# Patient Record
Sex: Female | Born: 1960 | Race: Black or African American | Hispanic: No | Marital: Single | Smoking: Never smoker
Health system: Southern US, Community
[De-identification: ages and names within clinical notes are randomized; demographics above are authoritative.]

## PROBLEM LIST (undated history)

## (undated) DIAGNOSIS — E119 Type 2 diabetes mellitus without complications: Secondary | ICD-10-CM

## (undated) DIAGNOSIS — Z5189 Encounter for other specified aftercare: Secondary | ICD-10-CM

## (undated) DIAGNOSIS — D122 Benign neoplasm of ascending colon: Secondary | ICD-10-CM

## (undated) DIAGNOSIS — I10 Essential (primary) hypertension: Secondary | ICD-10-CM

## (undated) DIAGNOSIS — D649 Anemia, unspecified: Secondary | ICD-10-CM

## (undated) DIAGNOSIS — J309 Allergic rhinitis, unspecified: Secondary | ICD-10-CM

## (undated) HISTORY — PX: ABDOMINAL HYSTERECTOMY: SHX81

## (undated) HISTORY — PX: OTHER SURGICAL HISTORY: SHX169

## (undated) HISTORY — DX: Morbid (severe) obesity due to excess calories: E66.01

## (undated) HISTORY — PX: CARPAL TUNNEL RELEASE: SHX101

## (undated) HISTORY — DX: Type 2 diabetes mellitus without complications: E11.9

## (undated) HISTORY — DX: Anemia, unspecified: D64.9

---

## 1986-08-04 HISTORY — PX: TUBAL LIGATION: SHX77

## 1998-01-10 ENCOUNTER — Emergency Department (HOSPITAL_COMMUNITY): Admission: EM | Admit: 1998-01-10 | Discharge: 1998-01-11 | Payer: Self-pay | Admitting: Emergency Medicine

## 1999-03-19 ENCOUNTER — Encounter: Admission: RE | Admit: 1999-03-19 | Discharge: 1999-06-17 | Payer: Self-pay | Admitting: Obstetrics

## 1999-10-02 ENCOUNTER — Encounter: Admission: RE | Admit: 1999-10-02 | Discharge: 1999-10-02 | Payer: Self-pay | Admitting: Family Medicine

## 1999-10-02 ENCOUNTER — Encounter: Payer: Self-pay | Admitting: Family Medicine

## 2002-07-13 ENCOUNTER — Encounter: Admission: RE | Admit: 2002-07-13 | Discharge: 2002-07-13 | Payer: Self-pay | Admitting: Family Medicine

## 2002-07-13 ENCOUNTER — Encounter: Payer: Self-pay | Admitting: Family Medicine

## 2004-09-10 ENCOUNTER — Emergency Department: Payer: Self-pay | Admitting: Emergency Medicine

## 2004-09-11 ENCOUNTER — Other Ambulatory Visit: Payer: Self-pay

## 2004-09-12 ENCOUNTER — Ambulatory Visit: Payer: Self-pay | Admitting: Family Medicine

## 2004-09-23 ENCOUNTER — Ambulatory Visit: Payer: Self-pay | Admitting: Family Medicine

## 2004-10-30 ENCOUNTER — Encounter: Admission: RE | Admit: 2004-10-30 | Discharge: 2004-10-30 | Payer: Self-pay | Admitting: Family Medicine

## 2005-09-30 ENCOUNTER — Ambulatory Visit: Payer: Self-pay | Admitting: Internal Medicine

## 2005-10-07 ENCOUNTER — Ambulatory Visit: Payer: Self-pay | Admitting: Family Medicine

## 2006-01-08 ENCOUNTER — Emergency Department: Payer: Self-pay | Admitting: Emergency Medicine

## 2006-01-19 ENCOUNTER — Emergency Department: Payer: Self-pay | Admitting: Unknown Physician Specialty

## 2006-02-13 ENCOUNTER — Encounter: Admission: RE | Admit: 2006-02-13 | Discharge: 2006-02-13 | Payer: Self-pay | Admitting: Family Medicine

## 2006-02-13 ENCOUNTER — Other Ambulatory Visit: Admission: RE | Admit: 2006-02-13 | Discharge: 2006-02-13 | Payer: Self-pay | Admitting: Gynecology

## 2006-05-26 ENCOUNTER — Ambulatory Visit: Payer: Self-pay | Admitting: Family Medicine

## 2006-06-03 ENCOUNTER — Ambulatory Visit: Payer: Self-pay | Admitting: Cardiology

## 2006-06-15 ENCOUNTER — Ambulatory Visit: Payer: Self-pay

## 2006-06-23 ENCOUNTER — Ambulatory Visit: Payer: Self-pay | Admitting: Cardiology

## 2006-09-10 ENCOUNTER — Ambulatory Visit: Payer: Self-pay | Admitting: Family Medicine

## 2006-09-10 LAB — CONVERTED CEMR LAB
ALT: 8 units/L (ref 0–40)
AST: 16 units/L (ref 0–37)
Albumin: 3 g/dL — ABNORMAL LOW (ref 3.5–5.2)
Alkaline Phosphatase: 37 units/L — ABNORMAL LOW (ref 39–117)
BUN: 14 mg/dL (ref 6–23)
Basophils Absolute: 0 10*3/uL (ref 0.0–0.1)
Basophils Relative: 0.5 % (ref 0.0–1.0)
Bilirubin, Direct: 0.1 mg/dL (ref 0.0–0.3)
CO2: 28 meq/L (ref 19–32)
Calcium: 8.9 mg/dL (ref 8.4–10.5)
Chloride: 108 meq/L (ref 96–112)
Creatinine, Ser: 0.8 mg/dL (ref 0.4–1.2)
Eosinophils Absolute: 0.3 10*3/uL (ref 0.0–0.6)
Eosinophils Relative: 4.9 % (ref 0.0–5.0)
Ferritin: 3.3 ng/mL — ABNORMAL LOW (ref 10.0–291.0)
GFR calc Af Amer: 101 mL/min
GFR calc non Af Amer: 83 mL/min
Glucose, Bld: 87 mg/dL (ref 70–99)
HCT: 26.6 % — ABNORMAL LOW (ref 36.0–46.0)
Hemoglobin: 8.3 g/dL — ABNORMAL LOW (ref 12.0–15.0)
Lymphocytes Relative: 37.9 % (ref 12.0–46.0)
MCHC: 31.4 g/dL (ref 30.0–36.0)
MCV: 67.6 fL — ABNORMAL LOW (ref 78.0–100.0)
Magnesium: 1.8 mg/dL (ref 1.5–2.5)
Monocytes Absolute: 0.8 10*3/uL — ABNORMAL HIGH (ref 0.2–0.7)
Monocytes Relative: 12.1 % — ABNORMAL HIGH (ref 3.0–11.0)
Neutro Abs: 2.5 10*3/uL (ref 1.4–7.7)
Neutrophils Relative %: 44.6 % (ref 43.0–77.0)
Platelets: 439 10*3/uL — ABNORMAL HIGH (ref 150–400)
Potassium: 4 meq/L (ref 3.5–5.1)
RBC: 3.98 M/uL (ref 3.87–5.11)
RDW: 18.1 % — ABNORMAL HIGH (ref 11.5–14.6)
Retic Count, Absolute: 67.5 (ref 19.0–186.0)
Retic Ct Pct: 1.6 % (ref 0.4–3.1)
Sodium: 141 meq/L (ref 135–145)
TSH: 1 microintl units/mL (ref 0.35–5.50)
Total Bilirubin: 0.7 mg/dL (ref 0.3–1.2)
Total Protein: 6.1 g/dL (ref 6.0–8.3)
Vitamin B-12: 386 pg/mL (ref 211–911)
WBC: 5.7 10*3/uL (ref 4.5–10.5)

## 2006-09-14 ENCOUNTER — Ambulatory Visit: Payer: Self-pay | Admitting: Family Medicine

## 2006-10-22 ENCOUNTER — Ambulatory Visit: Payer: Self-pay | Admitting: Family Medicine

## 2006-10-22 LAB — CONVERTED CEMR LAB
Basophils Absolute: 0.1 10*3/uL (ref 0.0–0.1)
Basophils Relative: 1.3 % — ABNORMAL HIGH (ref 0.0–1.0)
Eosinophils Absolute: 0.4 10*3/uL (ref 0.0–0.6)
Eosinophils Relative: 6.7 % — ABNORMAL HIGH (ref 0.0–5.0)
Ferritin: 12.7 ng/mL (ref 10.0–291.0)
HCT: 33.3 % — ABNORMAL LOW (ref 36.0–46.0)
Hemoglobin: 10.7 g/dL — ABNORMAL LOW (ref 12.0–15.0)
Lymphocytes Relative: 42.2 % (ref 12.0–46.0)
MCHC: 32.2 g/dL (ref 30.0–36.0)
MCV: 75.3 fL — ABNORMAL LOW (ref 78.0–100.0)
Monocytes Absolute: 0.7 10*3/uL (ref 0.2–0.7)
Monocytes Relative: 12.7 % — ABNORMAL HIGH (ref 3.0–11.0)
Neutro Abs: 2 10*3/uL (ref 1.4–7.7)
Neutrophils Relative %: 37.1 % — ABNORMAL LOW (ref 43.0–77.0)
Platelets: 396 10*3/uL (ref 150–400)
RBC: 4.42 M/uL (ref 3.87–5.11)
RDW: 25.1 % — ABNORMAL HIGH (ref 11.5–14.6)
Vitamin B-12: 358 pg/mL (ref 211–911)
WBC: 5.3 10*3/uL (ref 4.5–10.5)

## 2006-10-26 ENCOUNTER — Ambulatory Visit: Payer: Self-pay | Admitting: Family Medicine

## 2006-12-24 ENCOUNTER — Encounter (INDEPENDENT_AMBULATORY_CARE_PROVIDER_SITE_OTHER): Payer: Self-pay | Admitting: *Deleted

## 2006-12-29 ENCOUNTER — Encounter: Payer: Self-pay | Admitting: Family Medicine

## 2007-04-05 ENCOUNTER — Emergency Department: Payer: Self-pay | Admitting: Emergency Medicine

## 2007-05-26 ENCOUNTER — Encounter: Admission: RE | Admit: 2007-05-26 | Discharge: 2007-05-26 | Payer: Self-pay | Admitting: Family Medicine

## 2007-05-27 ENCOUNTER — Encounter (INDEPENDENT_AMBULATORY_CARE_PROVIDER_SITE_OTHER): Payer: Self-pay | Admitting: *Deleted

## 2008-05-25 ENCOUNTER — Ambulatory Visit: Payer: Self-pay | Admitting: Family Medicine

## 2008-05-30 ENCOUNTER — Ambulatory Visit: Payer: Self-pay | Admitting: Family Medicine

## 2009-01-03 ENCOUNTER — Ambulatory Visit: Payer: Self-pay | Admitting: Family Medicine

## 2009-01-04 LAB — CONVERTED CEMR LAB
FSH: 5.1 milliintl units/mL
LH: 1.64 milliintl units/mL
Prolactin: 11.4 ng/mL
TSH: 0.79 microintl units/mL (ref 0.35–5.50)

## 2009-01-22 ENCOUNTER — Ambulatory Visit: Payer: Self-pay | Admitting: Women's Health

## 2009-02-01 DIAGNOSIS — D649 Anemia, unspecified: Secondary | ICD-10-CM

## 2009-02-01 HISTORY — DX: Anemia, unspecified: D64.9

## 2009-02-19 ENCOUNTER — Ambulatory Visit: Payer: Self-pay | Admitting: Women's Health

## 2009-02-23 ENCOUNTER — Observation Stay (HOSPITAL_COMMUNITY): Admission: AD | Admit: 2009-02-23 | Discharge: 2009-02-24 | Payer: Self-pay | Admitting: Gynecology

## 2009-02-23 ENCOUNTER — Ambulatory Visit: Payer: Self-pay | Admitting: Gynecology

## 2009-03-07 ENCOUNTER — Ambulatory Visit: Payer: Self-pay | Admitting: Women's Health

## 2009-05-18 ENCOUNTER — Telehealth (INDEPENDENT_AMBULATORY_CARE_PROVIDER_SITE_OTHER): Payer: Self-pay | Admitting: Internal Medicine

## 2009-05-28 ENCOUNTER — Ambulatory Visit: Payer: Self-pay | Admitting: Women's Health

## 2009-05-28 ENCOUNTER — Encounter: Payer: Self-pay | Admitting: Women's Health

## 2009-05-28 ENCOUNTER — Other Ambulatory Visit: Admission: RE | Admit: 2009-05-28 | Discharge: 2009-05-28 | Payer: Self-pay | Admitting: Gynecology

## 2009-08-04 DIAGNOSIS — IMO0001 Reserved for inherently not codable concepts without codable children: Secondary | ICD-10-CM

## 2009-08-04 DIAGNOSIS — Z5189 Encounter for other specified aftercare: Secondary | ICD-10-CM

## 2009-08-04 HISTORY — DX: Encounter for other specified aftercare: Z51.89

## 2009-08-04 HISTORY — DX: Reserved for inherently not codable concepts without codable children: IMO0001

## 2009-08-14 ENCOUNTER — Ambulatory Visit: Payer: Self-pay | Admitting: Women's Health

## 2009-08-23 ENCOUNTER — Encounter: Payer: Self-pay | Admitting: Family Medicine

## 2009-08-23 ENCOUNTER — Ambulatory Visit: Payer: Self-pay | Admitting: Family Medicine

## 2009-08-27 ENCOUNTER — Telehealth: Payer: Self-pay | Admitting: Family Medicine

## 2009-08-28 ENCOUNTER — Ambulatory Visit: Payer: Self-pay | Admitting: Family Medicine

## 2009-11-01 ENCOUNTER — Ambulatory Visit: Payer: Self-pay | Admitting: Women's Health

## 2010-01-24 ENCOUNTER — Ambulatory Visit: Payer: Self-pay | Admitting: Women's Health

## 2010-03-06 ENCOUNTER — Encounter (INDEPENDENT_AMBULATORY_CARE_PROVIDER_SITE_OTHER): Payer: Self-pay | Admitting: *Deleted

## 2010-03-22 ENCOUNTER — Ambulatory Visit: Payer: Self-pay | Admitting: Family Medicine

## 2010-03-22 DIAGNOSIS — I10 Essential (primary) hypertension: Secondary | ICD-10-CM

## 2010-03-22 DIAGNOSIS — J309 Allergic rhinitis, unspecified: Secondary | ICD-10-CM | POA: Insufficient documentation

## 2010-03-22 DIAGNOSIS — B351 Tinea unguium: Secondary | ICD-10-CM

## 2010-04-24 ENCOUNTER — Ambulatory Visit: Payer: Self-pay | Admitting: Women's Health

## 2010-06-11 ENCOUNTER — Encounter (INDEPENDENT_AMBULATORY_CARE_PROVIDER_SITE_OTHER): Payer: Self-pay | Admitting: *Deleted

## 2010-06-18 ENCOUNTER — Ambulatory Visit: Payer: Self-pay | Admitting: Family Medicine

## 2010-09-03 NOTE — Letter (Signed)
Summary: Nadara Eaton letter  [place] at [facility]  [address] [place], [place] [address]   Phone: [phone]  Fax: [phone]       03/06/2010 MRN: [id number]  [facility] [address] [place], [place]  [address]  Dear Ms. Vest,  [facility] [place] and [place] announce the retirement of Arta Silence, M.D., from full-time practice at the [facility] office effective January 31, 2010 and his plans of returning part-time.  It is important to Dr. Hetty Ely and to our practice that you understand that [facility] has seven physicians in our office for your health care needs.  We will continue to offer the same exceptional care that you have today.    Dr. Hetty Ely has spoken to many of you about his plans for retirement and returning part-time in the fall.   We will continue to work with you through the transition to schedule appointments for you in the office and meet the high standards that [place] is committed to.   Again, it is with great pleasure that we share the news that Dr. Hetty Ely will return to [facility] at [facility] in October of 2011 with a reduced schedule.    If you have any questions, or would like to request an appointment with one of our physicians, please call us at [phone] and press the option for Scheduling an appointment.  We take pleasure in providing you with excellent patient care and look forward to seeing you at your next office visit.  Our [facility] Physicians are:  Tillman Abide, M.D. Laurita Quint, M.D. Roxy Manns, M.D. Kerby Nora, M.D. Hannah Beat, M.D. Ruthe Mannan, M.D. We proudly welcomed Raechel Ache, M.D. and Eustaquio Boyden, M.D. to the practice in July/August 2011.  Sincerely,  [facility]

## 2010-09-03 NOTE — Progress Notes (Signed)
Summary: This AM started productive cough and vomited x 1  Phone Note Call from Patient Call back at [phone]   Caller: Patient Call For: Dr. Dayton Martes Summary of Call: Pt saw Dr Dayton Martes on 08/23/09. Pt started this AM with  productive cough with dark brownish yellow phlegm this AM. Pt also vomited x1 after coughing episode.  Pt feels nauseated. Pt has not had wheezing but has had some shortness of breath. No fever but scratchy throat. When seen on Thur forgot to tell Dr. Dayton Martes has hx of problem with iron level being low and pt said her color in her face is pale this AM..  Pt was going back to work this AM but after got sick did not go to work. Pt uses  [facility]  [phone] if needed. Please advise.  Initial call taken by: Lewanda Rife LPN,  August 27, 2009 11:58 AM  Follow-up for Phone Call        Sounds like she vomited from coughing, which can happen.  can try cough suppresant.  In terms of feeling weak, pale/low iron, obviously needs to be seen for that.   Follow-up by: Ruthe Mannan MD,  August 27, 2009 12:02 PM  Additional Follow-up for Phone Call Additional follow up Details #1::        Patient notified as instructed by telephone. Pt has appt with Dr. Ermalene Searing on 08/28/09 at 3:45pm. Dr. Dayton Martes had said to ck with PCP but Dr. Hetty Ely did not have appt for  3 days and Dr. Dayton Martes said OK to workin with one of other drs. Lewanda Rife LPN  August 27, 2009 2:13 PM

## 2010-09-03 NOTE — Assessment & Plan Note (Signed)
Summary: 10:15  FEVER,CHILLS/CLE   Vital Signs:  Patient profile:   50 year old female Height:      65 inches Weight:      312.38 pounds BMI:     52.17 Temp:     98.4 degrees F oral Pulse rate:   80 / minute Pulse rhythm:   regular BP sitting:   130 / 84  (left arm) Cuff size:   large  Vitals Entered By: Delilah Shan CMA Duncan Dull) (August 23, 2009 10:19 AM) CC: Fever, chills   History of Present Illness: 50 yo with 3 day h/o fever, Tmax 99.5, chills, runny nose. No cough, no sore throat, no ear pain. Has h/o asthma--no wheezing, no shortness of breath. A lot of people at work with similar symptoms. Eating and drinking ok. No n/v/d. No rashes.  Current Medications (verified): 1)  Advair Diskus 250-50 Mcg/dose Misc (Fluticasone-Salmeterol) .... Take As Directed 2)  Proventil Hfa 108 (90 Base) Mcg/act Aers (Albuterol Sulfate) .Marland Kitchen.. 1-2 Puffs Every 4 Hrs As Needed Wheezing 3)  Iron 325 (65 Fe) Mg Tabs (Ferrous Sulfate) .... Takes 2 Per Day 4)  Depo-Provera 150 Mg/ml Susp (Medroxyprogesterone Acetate) .... As Directed  Allergies (verified): No Known Drug Allergies  Review of Systems      See HPI General:  Complains of chills and fever. ENT:  Complains of nasal congestion; denies earache, sinus pressure, and sore throat. CV:  Denies chest pain or discomfort. Resp:  Denies cough, shortness of breath, sputum productive, and wheezing.  Physical Exam  General:  alert, well-developed, well-nourished, and well-hydrated.  morbidly obese Ears:  R ear normal and L ear normal.   Nose:  External nasal examination shows no deformity or inflammation. Nasal mucosa are pink and moist without lesions or exudates. Mouth:  no exudates and pharyngeal erythema.   Lungs:  normal respiratory effort, no intercostal retractions, no accessory muscle use, and normal breath sounds.   Heart:  normal rate, regular rhythm, and no murmur.   Psych:  normally interactive and good eye contact.      Impression & Recommendations:  Problem # 1:  URI (ICD-465.9) Assessment New Likely viral.  Continue supportive care.  See patient instrucitons for details.  Complete Medication List: 1)  Advair Diskus 250-50 Mcg/dose Misc (Fluticasone-salmeterol) .... Take as directed 2)  Proventil Hfa 108 (90 Base) Mcg/act Aers (Albuterol sulfate) .Marland Kitchen.. 1-2 puffs every 4 hrs as needed wheezing 3)  Iron 325 (65 Fe) Mg Tabs (Ferrous sulfate) .... Takes 2 per day 4)  Depo-provera 150 Mg/ml Susp (Medroxyprogesterone acetate) .... As directed  Patient Instructions: 1)  Get plenty of rest, drink lots of clear liquids, and use Tylenol or Ibuprofen for fever and comfort. Return in 7-10 days if you're not better: sooner if you'er feeling worse.   Please let us know you start coughing up sputum or fevers worsen. Prescriptions: PROVENTIL HFA 108 (90 BASE) MCG/ACT AERS (ALBUTEROL SULFATE) 1-2 puffs every 4 hrs as needed wheezing  #1 x 1   Entered and Authorized by:   Ruthe Mannan MD   Signed by:   Ruthe Mannan MD on 08/23/2009   Method used:   Electronically to        [facility]* (retail)       [address]       [place], [place]  [address]       Ph: [phone]       Fax: [phone]   RxID:   [id number] ADVAIR DISKUS 250-50 MCG/DOSE MISC (  FLUTICASONE-SALMETEROL) take as directed  #1 x 6   Entered and Authorized by:   Ruthe Mannan MD   Signed by:   Ruthe Mannan MD on 08/23/2009   Method used:   Electronically to        [facility]* (retail)       [address]       [place], [place]  [address]       Ph: [phone]       Fax: [phone]   RxID:   [id number]   Current Allergies (reviewed today): No known allergies

## 2010-09-03 NOTE — Letter (Signed)
Summary: Out of Work  [facility] [employer] [facility] at [facility]  [address] [place], [place] [address]   Phone: [phone]  Fax: [phone]    August 23, 2009   Employee:  SHENEA GIACOBBE Furia    To Whom It May Concern:   For Medical reasons, please excuse the above named employee from work for the following dates:  Start:   08/23/2009  End:   08/25/2009  If you need additional information, please feel free to contact our office.         Sincerely,    Ruthe Mannan MD

## 2010-09-03 NOTE — Assessment & Plan Note (Signed)
Summary: NAUSEA, WEAK, HX LOW IRON LEVEL/RI  MD running behind. Pt left before being seen. No charge. Kerby Nora MD  September 10, 2009 11:02 AM  Vital Signs:  Patient profile:   50 year old female Height:      65 inches Weight:      312.38 pounds BMI:     52.17 Temp:     98.6 degrees F oral Pulse rate:   80 / minute Pulse rhythm:   regular BP sitting:   120 / 82  (left arm) Cuff size:   large  Vitals Entered By: Benny Lennert CMA Duncan Dull) (August 28, 2009 3:59 PM)  History of Present Illness: Chief complaint weak and history of low iron  Allergies (verified): No Known Drug Allergies   Complete Medication List: 1)  Advair Diskus 250-50 Mcg/dose Misc (Fluticasone-salmeterol) .... Take as directed 2)  Proventil Hfa 108 (90 Base) Mcg/act Aers (Albuterol sulfate) .Marland Kitchen.. 1-2 puffs every 4 hrs as needed wheezing 3)  Iron 325 (65 Fe) Mg Tabs (Ferrous sulfate) .... Takes 2 per day 4)  Depo-provera 150 Mg/ml Susp (Medroxyprogesterone acetate) .... As directed  Other Orders: No Charge Patient Arrived (NCPA0) (NCPA0)  Current Allergies (reviewed today): No known allergies

## 2010-09-03 NOTE — Assessment & Plan Note (Signed)
Summary: blood pressure elevated/alc   Vital Signs:  Patient profile:   50 year old female Height:      65 inches Weight:      311.0 pounds BMI:     51.94 Temp:     99.0 degrees F oral Pulse rate:   80 / minute Pulse rhythm:   regular BP sitting:   120 / 72  (left arm) Cuff size:   large  Vitals Entered By: Benny Lennert CMA Duncan Dull) (March 22, 2010 3:32 PM)  History of Present Illness: Chief complaint blood pressure elevated   [facility]...147[facility]152/97-95 in last week.  Since then BPs in nmla range. No headache, not dizzy.   Recently having congestion oin nostrils..no runny nose x 2 months. HAs year round allergies. No sinus pain. HAs been taking decongestant med in last few weeks. No wheeze, no cough.  Problems Prior to Update: 1)  Uri  (ICD-465.9) 2)  Irregular Menstrual Cycle  (ICD-626.4) 3)  Wheezing  (ICD-786.07) 4)  Hx, Family, Malignancy, Breast  (ICD-V16.3) 5)  Hx, Family, Malignancy, Ovary  (ICD-V16.41) 6)  Hx of Obesity  (ICD-278.00) 7)  Asthma  (ICD-493.90)  Current Medications (verified): 1)  Iron 325 (65 Fe) Mg Tabs (Ferrous Sulfate) .... Takes 2 Per Day 2)  Depo-Provera 150 Mg/ml Susp (Medroxyprogesterone Acetate) .... As Directed  Allergies (verified): No Known Drug Allergies  Past History:  Past medical, surgical, family and social histories (including risk factors) reviewed, and no changes noted (except as noted below).  Past Medical History: Reviewed history from 12/25/2006 and no changes required. Asthma Anemia secondary to menorrhagia  Past Surgical History: Reviewed history from 12/25/2006 and no changes required. NSVD x 2 [facility] Asthma Multip last 03/1998 weather triggers 2/yr No intubations Pelvic US- normal except left ovary not visualized well Dobutamine echo- normal (06/2006) Carpal tunnel release- bilateral Tubal ligation  Family History: Reviewed history from 12/25/2006 and no changes required. Father: A HTN, CABG x 4  (08/1998) Mother: dec 50 breast cancer Siblings: 2 brothers, 2 sisters- another sister deceased from ovarian cancer  Social History: Reviewed history from 05/25/2008 and no changes required. 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 ENT:  Denies earache. CV:  Denies chest pain or discomfort. Resp:  Denies shortness of breath.  Physical Exam  General:  morbidly obese female in NAD Mouth:  Oral mucosa and oropharynx without lesions or exudates.  Teeth in good repair. Neck:  no carotid bruit or thyromegaly no cervical or supraclavicular lymphadenopathy  Lungs:  Normal respiratory effort, chest expands symmetrically. Lungs are clear to auscultation, no crackles or wheezes. Heart:  Normal rate and regular rhythm. S1 and S2 normal without gallop, murmur, click, rub or other extra sounds. Pulses:  R and L posterior tibial pulses are full and equal bilaterally  Extremities:  no edema Skin:  Intact without suspicious lesions or rashes  B toenail fungus.  Psych:  Cognition and judgment appear intact. Alert and cooperative with normal attention span and concentration. No apparent delusions, illusions, hallucinations   Impression & Recommendations:  Problem # 1:  ELEVATED BLOOD PRESSURE WITHOUT DIAGNOSIS OF HYPERTENSION (ICD-796.2) Likely due to decongestants. Discussed lifestyle change, healthy eating habits, increasinfg exercise.   Problem # 2:  ALLERGIC RHINITIS CAUSE UNSPECIFIED (ICD-477.9) Does not tolerate nasal steroids. Start Zyrtec at bedtime.   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   Complete Medication List: 1)  Iron 325 (65 Fe) Mg Tabs (Ferrous sulfate) .... Takes 2 per day 2)  Depo-provera 150 Mg/ml Susp  (Medroxyprogesterone acetate) .... As directed  Patient Instructions: 1)  Fasting lipids, CMET Dx v77.91 2)   Start zyrtec at bedtime for allergies. 3)  Avoiding decongestants. 4)  Follow BP at home...goal BP <140/90 5)  Increase exercsie 3-5 times a day... avoid skipping meals. 6)   Syop liquid calories like soda, juice, sweet tea. etc.  7)  Follow up 3 months BP check/weight check.   Current Allergies (reviewed today): No known allergies   PAP Result Date:  09/04/2009 PAP Result:  normal PAP Next Due:  1 yr

## 2010-09-03 NOTE — Letter (Signed)
Summary: [place] No Show Letter  [place] at [facility]  [address] [place], [place] [address]   Phone: [phone]  Fax: [phone]    06/11/2010 MRN: [id number]  [facility] [place] [address] [place], [place]  [address]   Dear Stacy Meyer,   Our records indicate that you missed your scheduled appointment with ____LAB_________________ on __11.8.2011__________.  Please contact this office to reschedule your appointment as soon as possible.  It is important that you keep your scheduled appointments with your physician, so we can provide you the best care possible.  Please be advised that there may be a charge for "no show" appointments.    Sincerely,    at [facility]

## 2010-09-12 ENCOUNTER — Telehealth: Payer: Self-pay | Admitting: Family Medicine

## 2010-09-18 ENCOUNTER — Telehealth: Payer: Self-pay | Admitting: Family Medicine

## 2010-09-19 ENCOUNTER — Other Ambulatory Visit: Payer: Self-pay | Admitting: Women's Health

## 2010-09-19 ENCOUNTER — Encounter (INDEPENDENT_AMBULATORY_CARE_PROVIDER_SITE_OTHER): Payer: PRIVATE HEALTH INSURANCE | Admitting: Women's Health

## 2010-09-19 ENCOUNTER — Other Ambulatory Visit (HOSPITAL_COMMUNITY)
Admission: RE | Admit: 2010-09-19 | Discharge: 2010-09-19 | Disposition: A | Discharge status: Home / Self Care | Payer: PRIVATE HEALTH INSURANCE | Source: Ambulatory Visit | Attending: Gynecology | Admitting: Gynecology

## 2010-09-19 ENCOUNTER — Encounter: Payer: Self-pay | Admitting: Family Medicine

## 2010-09-19 DIAGNOSIS — R823 Hemoglobinuria: Secondary | ICD-10-CM

## 2010-09-19 DIAGNOSIS — Z124 Encounter for screening for malignant neoplasm of cervix: Secondary | ICD-10-CM | POA: Insufficient documentation

## 2010-09-19 DIAGNOSIS — Z833 Family history of diabetes mellitus: Secondary | ICD-10-CM

## 2010-09-19 DIAGNOSIS — Z01419 Encounter for gynecological examination (general) (routine) without abnormal findings: Secondary | ICD-10-CM

## 2010-09-19 DIAGNOSIS — Z13220 Encounter for screening for lipoid disorders: Secondary | ICD-10-CM

## 2010-09-19 NOTE — Progress Notes (Signed)
Summary: refill request for advair  Phone Note Refill Request Message from:  Fax from Pharmacy  Refills Requested: Medication #1:  advair 250/50   Last Refilled: 08/23/2009 Faxed request from [facility], this is no longer on med list.  Initial call taken by: Lowella Petties CMA, AAMA,  September 12, 2010 8:58 AM  Follow-up for Phone Call        Please have pt come in to see me if she would like to have me follow her. I haven't seen her in many years if I have ever seen her. Would appreciate 30 mins as her care has been fragmented at best for a while. Hold on Advair until seen....could give her one one week sample if really needed. Follow-up by: Shaune Leeks MD,  September 12, 2010 9:04 AM  Additional Follow-up for Phone Call Additional follow up Details #1::        Advised pt, appt scheduled for next week. Additional Follow-up by: Lowella Petties CMA, AAMA,  September 12, 2010 9:26 AM

## 2010-09-20 ENCOUNTER — Telehealth: Payer: Self-pay | Admitting: Family Medicine

## 2010-09-25 ENCOUNTER — Encounter: Payer: Self-pay | Admitting: Family Medicine

## 2010-09-25 ENCOUNTER — Ambulatory Visit (INDEPENDENT_AMBULATORY_CARE_PROVIDER_SITE_OTHER): Payer: PRIVATE HEALTH INSURANCE | Admitting: Family Medicine

## 2010-09-25 DIAGNOSIS — J45909 Unspecified asthma, uncomplicated: Secondary | ICD-10-CM

## 2010-09-25 DIAGNOSIS — E669 Obesity, unspecified: Secondary | ICD-10-CM

## 2010-09-25 DIAGNOSIS — Z803 Family history of malignant neoplasm of breast: Secondary | ICD-10-CM

## 2010-09-25 DIAGNOSIS — J309 Allergic rhinitis, unspecified: Secondary | ICD-10-CM

## 2010-09-25 DIAGNOSIS — R030 Elevated blood-pressure reading, without diagnosis of hypertension: Secondary | ICD-10-CM

## 2010-09-25 NOTE — Progress Notes (Signed)
Summary: requests advair  Phone Note Call from Patient   Caller: Patient Call For: Shaune Leeks MD Summary of Call: Pt has appt to see you 2/22.  Can we send one month of advair to [facility]?  Pt thought her appt was today, but it wasnt.         Lowella Petties CMA, AAMA  September 18, 2010 3:31 PM   Follow-up for Phone Call        Medicine called to [facility] [address], cancelled at [place]. Follow-up by: Lowella Petties CMA, AAMA,  September 19, 2010 8:41 AM    New/Updated Medications: ADVAIR DISKUS 100-50 MCG/DOSE AEPB (FLUTICASONE-SALMETEROL) one inh two times a day Prescriptions: ADVAIR DISKUS 100-50 MCG/DOSE AEPB (FLUTICASONE-SALMETEROL) one inh two times a day  #1 mdi x 0   Entered and Authorized by:   Shaune Leeks MD   Signed by:   Shaune Leeks MD on 09/18/2010   Method used:   Electronically to        [facility]* (retail)       [address]       [place], [place]  [address]       Ph: [phone]       Fax: [phone]   RxID:   [id number]

## 2010-09-25 NOTE — Progress Notes (Signed)
Summary: Rx Proventil  Phone Note From Pharmacy Call back at [phone]   Caller: [facility]* Call For: Dr. Dayton Martes  Summary of Call: Received faxed refill request for Proventil HFA inhaler which is no longer on patients medication list.  Please advise.  Medication was removed from list on 03/2010. Initial call taken by: Linde Gillis CMA Duncan Dull),  September 20, 2010 9:27 AM    New/Updated Medications: PROAIR HFA 108 (90 BASE) MCG/ACT  AERS (ALBUTEROL SULFATE) 2 inh q4h as needed shortness of breath Prescriptions: PROAIR HFA 108 (90 BASE) MCG/ACT  AERS (ALBUTEROL SULFATE) 2 inh q4h as needed shortness of breath  #1 x 1   Entered and Authorized by:   Ruthe Mannan MD   Signed by:   Ruthe Mannan MD on 09/20/2010   Method used:   Electronically to        [facility]* (retail)       [address]       [place], [place]  [address]       Ph: [phone]       Fax: [phone]   RxID:   [id number]

## 2010-10-01 NOTE — Assessment & Plan Note (Signed)
Summary: RENEW MEDS- 30 MINS PER DR Siobhan Zaro   Vital Signs:  Patient profile:   50 year old female Weight:      321.75 pounds Temp:     98.8 degrees F oral Pulse rate:   76 / minute Pulse rhythm:   regular BP sitting:   128 / 78  (left arm) Cuff size:   large  Vitals Entered By: Sydell Axon LPN (September 25, 2010 3:28 PM) CC: Renew medication   History of Present Illness: Pt sees Gyn, Dr Maryelizabeth Rowan at [facility]. She had labwork done as well. She has some probs with allergies...she takes Sudafed!!! Told to stop, Discussed allergy meds that would be acceptable to take. She is now working at [employer]. HEr father died last year. She feels well and has no complaints. She uses ProAir and Advair as needed. She typically has them filled once a year.   Preventive Screening-Counseling & Management  Alcohol-Tobacco     Alcohol drinks/day: <1     Alcohol type: brandy     Smoking Status: never  Caffeine-Diet-Exercise     Caffeine use/day: 4     Does Patient Exercise: no  Problems Prior to Update: 1)  Onychomycosis, Bilateral  (ICD-110.1) 2)  Allergic Rhinitis Cause Unspecified  (ICD-477.9) 3)  Elevated Blood Pressure Without Diagnosis of Hypertension  (ICD-796.2) 4)  Irregular Menstrual Cycle  (ICD-626.4) 5)  Wheezing  (ICD-786.07) 6)  Hx, Family, Malignancy, Breast  (ICD-V16.3) 7)  Hx, Family, Malignancy, Ovary  (ICD-V16.41) 8)  Hx of Obesity  (ICD-278.00) 9)  Asthma  (ICD-493.90)  Medications Prior to Update: 1)  Iron 325 (65 Fe) Mg Tabs (Ferrous Sulfate) .... Takes 2 Per Day 2)  Depo-Provera 150 Mg/ml Susp (Medroxyprogesterone Acetate) .... As Directed 3)  Advair Diskus 100-50 Mcg/dose Aepb (Fluticasone-Salmeterol) .... One Inh Two Times A Day 4)  Proair Hfa 108 (90 Base) Mcg/act  Aers (Albuterol Sulfate) .... 2 Inh Q4h As Needed Shortness of Breath  Current Medications (verified): 1)  Advair Diskus 100-50 Mcg/dose Aepb (Fluticasone-Salmeterol) .... One Inh Two  Times A Day 2)  Proair Hfa 108 (90 Base) Mcg/act  Aers (Albuterol Sulfate) .... 2 Inh Q4h As Needed Shortness of Breath 3)  Integra Plus  Caps (Fefum-Fepoly-Fa-B Cmp-C-Biot) .... Take One By Mouth Daily 4)  Errin 0.35 Mg Tabs (Norethindrone) .... Take One By Mouth Daily  Allergies: No Known Drug Allergies  Past History:  Past Medical History: Last updated: 12/25/2006 Asthma Anemia secondary to menorrhagia  Past Surgical History: Last updated: 12/25/2006 NSVD x 2 [facility] Asthma Multip last 03/1998 weather triggers 2/yr No intubations Pelvic US- normal except left ovary not visualized well Dobutamine echo- normal (06/2006) Carpal tunnel release- bilateral Tubal ligation  Family History: Last updated: 09/25/2010 Father: dec 72 Lung Dz Pulm Edema CHF  HTN, CABG x 4 (08/1998) Mother: dec 50 breast cancer Brother dec 47 Mouth Cancer (smoke/alcoholic) Brother A 46 Alcoholic HTN Train Accdt w/ one leg Sister A 49 Alcoholic Ca ? Sister A 36 Htn   Sister dec 30  ovarian cancer  Social History: Last updated: 09/25/2010 Marital Status: single Children: 2 sons Occupation: [employer] nurse  Risk Factors: Alcohol Use: <1 (09/25/2010) Caffeine Use: 4 (09/25/2010) Exercise: no (09/25/2010)  Risk Factors: Smoking Status: never (09/25/2010)  Family History: Father: dec 72 Lung Dz Pulm Edema CHF  HTN, CABG x 4 (08/1998) Mother: dec 50 breast cancer Brother dec 47 Mouth Cancer (smoke/alcoholic) Brother A 46 Alcoholic HTN Train Accdt w/ one  leg Sister A 49 Alcoholic Ca ? Sister A 48 Htn   Sister dec 30  ovarian cancer  Social History: Marital Status: single Children: 2 sons Occupation: Environmental health practitioner Caffeine use/day:  4 Does Patient Exercise:  no  Review of Systems General:  Denies chills, fatigue, fever, sweats, weakness, and weight loss; sleeps 2-4 hrs. Taken off Depo in Dec.. Eyes:  Denies blurring, discharge, and eye pain. ENT:  Denies decreased hearing, ear  discharge, earache, and ringing in ears. CV:  Complains of palpitations and shortness of breath with exertion; denies chest pain or discomfort, fainting, fatigue, swelling of feet, and swelling of hands; too much caffeine. Resp:  Denies cough, shortness of breath, and wheezing. GI:  Denies abdominal pain, bloody stools, change in bowel habits, constipation, dark tarry stools, diarrhea, indigestion, loss of appetite, nausea, vomiting, vomiting blood, and yellowish skin color. GU:  Denies discharge and dysuria. MS:  Complains of low back pain; denies joint pain, muscle aches, cramps, and stiffness. Derm:  Denies dryness, itching, and rash. Neuro:  Denies numbness, poor balance, tingling, and tremors.  Physical Exam  General:  morbidly obese female in NAD Head:  Normocephalic and atraumatic without obvious abnormalities. No apparent alopecia or balding. Sinuses NT. Eyes:  Conjunctiva clear bilaterally.  Ears:  External ear exam shows no significant lesions or deformities.  Otoscopic examination reveals clear canals, tympanic membranes are intact bilaterally without bulging, retraction, inflammation or discharge. Hearing is grossly normal bilaterally. Nose:  External nasal examination shows no deformity or inflammation. Nasal mucosa are pink and moist without lesions or exudates. Mouth:  Oral mucosa and oropharynx without lesions or exudates.  Teeth in good repair. Neck:  No deformities, masses, or tenderness noted. Chest Wall:  No deformities, masses, or tenderness noted. Breasts:  Not done, Gyn. Lungs:  Normal respiratory effort, chest expands symmetrically. Lungs are clear to auscultation, no crackles or wheezes. Heart:  Normal rate and regular rhythm. S1 and S2 normal without gallop, murmur, click, rub or other extra sounds. Abdomen:  Bowel sounds positive,abdomen soft and non-tender without masses, organomegaly or hernias noted. Obese with panniculous. Rectal:  Not done, Gyn Genitalia:  Not  done, Gyn Msk:  No deformity or scoliosis noted of thoracic or lumbar spine.   Pulses:  R and L posterior tibial pulses are full and equal bilaterally  Extremities:  No clubbing, cyanosis, edema, or deformity noted with normal full range of motion of all joints.   Neurologic:  No cranial nerve deficits noted. Station and gait are normal. Sensory, motor and coordinative functions appear intact. Skin:  Intact without suspicious lesions or rashes  B toenail fungus.  Cervical Nodes:  No lymphadenopathy noted Inguinal Nodes:  No significant adenopathy Psych:  Cognition and judgment appear intact. Alert and cooperative with normal attention span and concentration. No apparent delusions, illusions, hallucinations   Impression & Recommendations:  Problem # 1:  ELEVATED BLOOD PRESSURE WITHOUT DIAGNOSIS OF HYPERTENSION (ICD-796.2) Assessment Improved Stabile, nml. Today 128/78 Prior BP: 120/72 (03/22/2010)  Labs Reviewed: Creat: 0.8 (09/10/2006)  Instructed in low sodium diet (DASH Handout) and behavior modification.    Problem # 2:  HX, FAMILY, MALIGNANCY, BREAST (ICD-V16.3) Assessment: Unchanged LAst mammo 2008...encouraged to get one...she is scheduled.  Problem # 3:  Hx of OBESITY (ICD-278.00)  Problem # 4:  ASTHMA (ICD-493.90) Assessment: Unchanged Discussed using ProAir less than once a week. If needed more, use Advair regularly for 2-4 weeks and then go back to as needed regimen. Her updated medication  list for this problem includes:    Advair Diskus 100-50 Mcg/dose Aepb (Fluticasone-salmeterol) ..... One inh two times a day    Proair Hfa 108 (90 Base) Mcg/act Aers (Albuterol sulfate) .Marland Kitchen... 2 inh q4h as needed shortness of breath  Problem # 5:  ALLERGIC RHINITIS CAUSE UNSPECIFIED (ICD-477.9) Assessment: Unchanged  Discussed use of allergy medications and environmental measures. May use Claritin, Allegra or Zyrtec.  Complete Medication List: 1)  Advair Diskus 100-50 Mcg/dose  Aepb (Fluticasone-salmeterol) .... One inh two times a day 2)  Proair Hfa 108 (90 Base) Mcg/act Aers (Albuterol sulfate) .... 2 inh q4h as needed shortness of breath 3)  Integra Plus Caps (Fefum-fepoly-fa-b cmp-c-biot) .... Take one by mouth daily 4)  Errin 0.35 Mg Tabs (Norethindrone) .... Take one by mouth daily  Patient Instructions: 1)  RTC as needed. One year if not before. 2)  25 mins spent with pt. Prescriptions: PROAIR HFA 108 (90 BASE) MCG/ACT  AERS (ALBUTEROL SULFATE) 2 inh q4h as needed shortness of breath  #1 x 1   Entered and Authorized by:   Shaune Leeks MD   Signed by:   Shaune Leeks MD on 09/25/2010   Method used:   Print then Give to Patient   RxID:   [id number] ADVAIR DISKUS 100-50 MCG/DOSE AEPB (FLUTICASONE-SALMETEROL) one inh two times a day  #1 mdi x 1   Entered and Authorized by:   Shaune Leeks MD   Signed by:   Shaune Leeks MD on 09/25/2010   Method used:   Print then Give to Patient   RxID:   [id number]    Orders Added: 1)  Est. Patient Level IV [03474]    Current Allergies (reviewed today): No known allergies

## 2010-11-10 LAB — CROSSMATCH
ABO/RH(D): O POS
Antibody Screen: NEGATIVE

## 2010-11-10 LAB — ABO/RH: ABO/RH(D): O POS

## 2010-11-10 LAB — CBC
Hemoglobin: 8.6 g/dL — ABNORMAL LOW (ref 12.0–15.0)
MCHC: 30 g/dL (ref 30.0–36.0)
MCV: 74.3 fL — ABNORMAL LOW (ref 78.0–100.0)
RBC: 3.86 MIL/uL — ABNORMAL LOW (ref 3.87–5.11)
WBC: 7.6 10*3/uL (ref 4.0–10.5)

## 2010-12-20 NOTE — Assessment & Plan Note (Signed)
[facility]                              CARDIOLOGY OFFICE NOTE   Ramani, Stacy Meyer                     MRN:          [id number]  DATE:06/03/2006                            DOB:          November 14, 1960    REFERRING PHYSICIAN:  Arta Silence, MD   REASON FOR VISIT:  Chest pain.   HISTORY OF PRESENT ILLNESS:  [name] is a pleasant 50 year old woman with  morbid obesity, longstanding asthma, and apparent hypertension. She has no  significant tobacco use history and states that her father developed  coronary artery disease around age 38. She has been in her usual state of  health until approximately 2 weeks ago. She was at work [employer])  and states that she felt somewhat lightheaded. She also began to experience  a sharp left-sided chest pain that was present off and on over the next 3  days. This was worse with movement and twisting of the thorax but not  associated with cough, active wheezing or hemoptysis. Since that time,  symptoms have completely resolved without specific intervention and she  feels back to baseline. She states in retrospect that she has felt this in  the past, had asthma exacerbations although this was more severe and at this  point she was not wheezing.   ALLERGIES:  No known drug allergies.   CURRENT MEDICATIONS:  1. Advair 250/50 daily.  2. Proventil p.r.n.   PAST MEDICAL HISTORY:  As outlined in the history of present illness. She  has also had bilateral carpal tunnel release surgery in 1994 and bilateral  tubal ligation in 1989.   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 She denies any tobacco use history. She drinks  alcohol socially. She has 3-5 caffeinated beverages daily.   REVIEW OF SYSTEMS:  As described in the history of present illness,  otherwise  negative.   FAMILY HISTORY:  Significant for breast cancer in the patient's mother who  died at the age of 22, ovarian cancer in the patient's sister who died at  age 53 and another type of cancer in one of the patient's brother who died  at age 48. Father is alive at age 77 with history of bypass surgery having  developed coronary artery disease around the age of 2.   PHYSICAL EXAMINATION:  VITAL SIGNS:  Blood pressure is 149/70, heart rate is  61, weight is 281 pounds.  GENERAL:  An obese woman in no acute distress without labored breathing or  chest pain.  HEENT:  Conjunctiva and lids are normal, oropharynx is clear.  NECK:  Supple without elevated jugular venous pressure or loud bruits. No  thyromegaly is noted.  LUNGS:  Exhibit clear breath sounds. No active wheezing.  CARDIAC:  Reveals a regular rate and rhythm without loud murmur or gallop.  ABDOMEN:  Soft and nontender. Bowel sounds present.  EXTREMITIES:  Show trace edema. Distal pulses are 2+.  SKIN:  No ulcer changes noted.  MUSCULOSKELETAL:  No kyphosis is noted.  NEUROPSYCHIATRIC:  The patient is alert and oriented x3.   A 12-lead electrocardiogram today shows normal sinus rhythm at 63 beats per  minute.   IMPRESSION:  1. Transient episodes of chest pain as outlined above. Description is      somewhat atypical for ischemic heart disease. She does have a longer      standing history of asthma. She states that her blood pressure has been      elevated recently and she does have a family history of coronary      disease. Her resting electrocardiogram is normal and she has had no      prior risk stratification. She also mentions that she has had some      lower extremity edema recently. We will therefore plan an      echocardiogram to assess left and right ventricular function and      valvular status then proceed with a dobutamine echocardiogram for      stress testing assuming that she has adequate echocardiographic       windows. Otherwise this will be changed to a dobutamine Myoview and      then we will have the patient followup in the office with [place] to discuss      the results. I will not any specific medication adjustments at this      particular time.  2. Further plans to follow.     Jonelle Sidle, MD  Electronically Signed    SGM/MedQ  DD: 06/03/2006  DT: 06/03/2006  Job #: [id number]   cc:   Arta Silence, MD

## 2010-12-20 NOTE — Cardiovascular Report (Signed)
[facility]                                  ECHOCARDIOGRAM   ADELY, FACER                     MRN:          [id number]  DATE:06/15/2006                            DOB:          Jul 31, 1961    REFERRING PHYSICIAN:  Jonelle Sidle, MD   CLINICAL DATA:  A 50 year old woman with history of bronchospastic lung  disease, multiple cardiovascular risk factors, and chest pain.   1. Progressive dobutamine infusion yielded a peak heart rate of 151, 85%      of the patient's age predicted maximum.  The only symptoms were      palpitations and mild flushing. Occasional PVCs occurred.  2. Baseline EKG: Normal sinus rhythm; minimal nonspecific ST-T wave      abnormality.  3. Stress EKG: Insignificant upsloping ST-segment depression.  4. Baseline echocardiogram: Normal left ventricular size and unction.  5. Stress echocardiogram:  Decreased left ventricular volume; marked      increase in regional and global contractility resulting in cavity      obliteration.   IMPRESSION:  Negative pharmacologic stress echocardiogram revealing neither  electrocardiographic nor echocardiographic evidence for myocardial ischemia  or infarction.  Other findings as noted.     Gerrit Friends. Dietrich Pates, MD, [facility]  Electronically Signed    RMR/MedQ  DD: 06/15/2006  DT: 06/15/2006  Job #: [id number]

## 2010-12-20 NOTE — Assessment & Plan Note (Signed)
[facility]                              CARDIOLOGY OFFICE NOTE   Stacy Meyer, Stacy Meyer                     MRN:          [id number]  DATE:06/23/2006                            DOB:          [birth year]    PRIMARY CARE PHYSICIAN:  Stacy Silence, MD.   REASON FOR VISIT:  Followup cardiac testing.   HISTORY OF PRESENT ILLNESS:  I saw Stacy Meyer back on the 31st of October.  She had presented at that time with somewhat atypical chest pain in the  setting of hypertension, obesity, asthma and premature cardiovascular  disease in her family. We referred her for further risk stratification and  she underwent a dobutamine echocardiogram. This study was interpreted by Dr.  Dietrich Meyer and showed no evidence of ischemia either by echocardiographic or  electrocardiographic criteria. I discussed the results with her today and  she was quite reassured. She states that she has had no further symptoms  since our last visit. I encouraged her to continue a strategy of risk factor  modification. She has been trying to work on weight loss recently.   ALLERGIES:  No known drug allergies.   CURRENT MEDICATIONS:  1. Advair 250/50.  2. Proventil p.r.n.   REVIEW OF SYSTEMS:  As described in the history of present illness.   PHYSICAL EXAMINATION:  VITAL SIGNS:  Blood pressure is 127/72, heart rate is  77, weight is 278 pounds.  GENERAL:  The patient is comfortable and in no acute distress.  LUNGS:  Clear without labored breathing at rest.  CARDIAC:  Reveals a regular rate and rhythm without loud murmur or gallop.  EXTREMITIES:  Show no pitting edema.   IMPRESSION/RECOMMENDATIONS:  1. Atypical chest pain. Recent dobutamine echocardiogram was normal and at      this point I would not anticipate any additional cardiac testing. [name] has been symptom-free since her last visit and I have encouraged      her to continue her strategy of risk factor  modification and regular followup with Dr. Hetty Meyer.  2. We can certainly see her back as needed.     Stacy Sidle, MD  Electronically Signed    SGM/MedQ  DD: 06/23/2006  DT: 06/23/2006  Job #: [id number]   cc:   Stacy Silence, MD

## 2011-08-19 ENCOUNTER — Other Ambulatory Visit: Payer: Self-pay | Admitting: Women's Health

## 2011-08-19 DIAGNOSIS — Z1231 Encounter for screening mammogram for malignant neoplasm of breast: Secondary | ICD-10-CM

## 2011-09-02 ENCOUNTER — Other Ambulatory Visit: Payer: Self-pay | Admitting: Women's Health

## 2011-09-02 ENCOUNTER — Ambulatory Visit
Admission: RE | Admit: 2011-09-02 | Discharge: 2011-09-02 | Disposition: A | Discharge status: Home / Self Care | Payer: PRIVATE HEALTH INSURANCE | Source: Ambulatory Visit | Attending: Women's Health | Admitting: Women's Health

## 2011-09-02 DIAGNOSIS — Z1231 Encounter for screening mammogram for malignant neoplasm of breast: Secondary | ICD-10-CM

## 2011-09-17 ENCOUNTER — Other Ambulatory Visit: Payer: Self-pay | Admitting: Women's Health

## 2011-09-18 DIAGNOSIS — J4520 Mild intermittent asthma, uncomplicated: Secondary | ICD-10-CM | POA: Insufficient documentation

## 2011-09-18 DIAGNOSIS — D649 Anemia, unspecified: Secondary | ICD-10-CM | POA: Insufficient documentation

## 2011-09-24 ENCOUNTER — Encounter: Payer: Self-pay | Admitting: Women's Health

## 2011-09-24 ENCOUNTER — Ambulatory Visit (INDEPENDENT_AMBULATORY_CARE_PROVIDER_SITE_OTHER): Payer: PRIVATE HEALTH INSURANCE | Admitting: Women's Health

## 2011-09-24 VITALS — BP 120/78 | Ht 64.0 in | Wt 329.0 lb

## 2011-09-24 DIAGNOSIS — Z833 Family history of diabetes mellitus: Secondary | ICD-10-CM

## 2011-09-24 DIAGNOSIS — D259 Leiomyoma of uterus, unspecified: Secondary | ICD-10-CM

## 2011-09-24 DIAGNOSIS — N920 Excessive and frequent menstruation with regular cycle: Secondary | ICD-10-CM

## 2011-09-24 DIAGNOSIS — D219 Benign neoplasm of connective and other soft tissue, unspecified: Secondary | ICD-10-CM

## 2011-09-24 DIAGNOSIS — Z01419 Encounter for gynecological examination (general) (routine) without abnormal findings: Secondary | ICD-10-CM

## 2011-09-24 LAB — GLUCOSE, RANDOM: Glucose, Bld: 91 mg/dL (ref 70–99)

## 2011-09-24 MED ORDER — MEDROXYPROGESTERONE ACETATE 10 MG PO TABS: 10.0000 mg | ORAL_TABLET | Freq: Every day | ORAL | Status: DC

## 2011-09-24 MED ORDER — NORETHINDRONE 0.35 MG PO TABS: 1.0000 | ORAL_TABLET | Freq: Every day | ORAL | Status: DC

## 2011-09-24 NOTE — Progress Notes (Signed)
Stacy Meyer September 16, 1960 [id number]    History:    The patient presents for annual exam.  History of extreme anemia from menorrhagia/fibroids needing transfusions in 2010. Was on Depo provera 150 q 3 months until 08/2010 when DepoProvera no longer covered by insurance and has been on Micronor since. Was having light monthly cycles until November 2012, states had increased menorrhagia and bled most of the month, 2 heavy cycles the month of December, no cycle in January, and currently bleeding greater than 7 days. History normal Paps and mammograms. History of BTL in 3, not sexually active in years. History of normal TSH and prolactin, sonohysterogram in 2010 without defect noted.  Past medical history, past surgical history, family history and social history were all reviewed and documented in the EPIC chart.   ROS:  A  ROS was performed and pertinent positives and negatives are included in the history.  Exam:  Filed Vitals:   09/24/11 1507  BP: 120/78    General appearance:  Normal Head/Neck:  Normal, without cervical or supraclavicular adenopathy. Thyroid:  Symmetrical, normal in size, without palpable masses or nodularity. Respiratory  Effort:  Normal  Auscultation:  Clear without wheezing or rhonchi Cardiovascular  Auscultation:  Regular rate, without rubs, murmurs or gallops  Edema/varicosities:  Not grossly evident Abdominal  Soft,nontender, without masses, guarding or rebound.  Liver/spleen:  No organomegaly noted  Hernia:  None appreciated  Skin  Inspection:  Grossly normal  Palpation:  Grossly normal Neurologic/psychiatric  Orientation:  Normal with appropriate conversation.  Mood/affect:  Normal  Genitourinary    Breasts: Examined lying and sitting.     Right: Without masses, retractions, discharge or axillary adenopathy.     Left: Without masses, retractions, discharge or axillary adenopathy.   Inguinal/mons:  Normal without inguinal adenopathy  External  genitalia:  Normal  BUS/Urethra/Skene's glands:  Normal  Bladder:  Normal  Vagina:  Normal  Cervix:  Normal/moderate amount of menses  Uterus:   Fibroids.  Midline and mobile/limited due to obesity  Adnexa/parametria:     Rt: Without masses or tenderness.   Lt: Without masses or tenderness.  Anus and perineum: Normal  Digital rectal exam: Normal sphincter tone without palpated masses or tenderness  Assessment/Plan:  51 y.o. SBF G4 P2 for annual exam with complaint of increased menorrhagia on micronor and weight gain.  History of severe anemia (4.9/15.9)  02/2009  from menorrhagia/fibroids Morbid obesity  Plan: Provera 10 mg daily for 10 days and schedule Sonohysterogram with biopsy with Dr. Lily Peer after bleeding stops. Resume Micronor daily. Prescription, proper use given and reviewed, continue until plan can be made after sonohysterogram. Reviewed briefly HER option. Declines Mirena  IUD states does not want. MVI with iron supplement daily, continue increased iron rich foods. Condoms encouraged if becomes sexually active. SBE's, a normal mammogram encouraged. Reviewed importance of increasing exercise, decreasing calories for weight loss. Had excellent lipid profile last year. CBC, glucose,TSH, Pap.    Harrington Challenger [facility], 5:07 PM 09/24/2011

## 2011-09-25 ENCOUNTER — Other Ambulatory Visit (HOSPITAL_COMMUNITY)
Admission: RE | Admit: 2011-09-25 | Discharge: 2011-09-25 | Disposition: A | Discharge status: Home / Self Care | Payer: PRIVATE HEALTH INSURANCE | Source: Ambulatory Visit | Attending: Obstetrics and Gynecology | Admitting: Obstetrics and Gynecology

## 2011-09-25 DIAGNOSIS — Z01419 Encounter for gynecological examination (general) (routine) without abnormal findings: Secondary | ICD-10-CM | POA: Insufficient documentation

## 2011-09-25 LAB — CBC WITH DIFFERENTIAL/PLATELET
Basophils Relative: 1 % (ref 0–1)
HCT: 38.2 % (ref 36.0–46.0)
Lymphocytes Relative: 37 % (ref 12–46)
Lymphs Abs: 2.8 10*3/uL (ref 0.7–4.0)
MCH: 26.9 pg (ref 26.0–34.0)
MCV: 86.4 fL (ref 78.0–100.0)
Monocytes Absolute: 0.7 10*3/uL (ref 0.1–1.0)
Platelets: 342 10*3/uL (ref 150–400)
RDW: 13.5 % (ref 11.5–15.5)

## 2011-09-25 LAB — TSH: TSH: 1.469 u[IU]/mL (ref 0.350–4.500)

## 2011-09-25 NOTE — Progress Notes (Signed)
Addended by: Venora Maples on: 09/25/2011 10:06 AM   Modules accepted: Orders

## 2011-10-15 ENCOUNTER — Other Ambulatory Visit: Payer: Self-pay | Admitting: Gynecology

## 2011-10-15 ENCOUNTER — Other Ambulatory Visit: Payer: PRIVATE HEALTH INSURANCE

## 2011-10-15 ENCOUNTER — Ambulatory Visit: Payer: PRIVATE HEALTH INSURANCE | Admitting: Gynecology

## 2011-10-15 DIAGNOSIS — N920 Excessive and frequent menstruation with regular cycle: Secondary | ICD-10-CM

## 2011-10-15 DIAGNOSIS — D219 Benign neoplasm of connective and other soft tissue, unspecified: Secondary | ICD-10-CM

## 2011-10-22 ENCOUNTER — Ambulatory Visit (INDEPENDENT_AMBULATORY_CARE_PROVIDER_SITE_OTHER): Payer: PRIVATE HEALTH INSURANCE

## 2011-10-22 ENCOUNTER — Ambulatory Visit (INDEPENDENT_AMBULATORY_CARE_PROVIDER_SITE_OTHER): Payer: PRIVATE HEALTH INSURANCE | Admitting: Gynecology

## 2011-10-22 ENCOUNTER — Encounter: Payer: Self-pay | Admitting: Gynecology

## 2011-10-22 DIAGNOSIS — D251 Intramural leiomyoma of uterus: Secondary | ICD-10-CM

## 2011-10-22 DIAGNOSIS — D219 Benign neoplasm of connective and other soft tissue, unspecified: Secondary | ICD-10-CM

## 2011-10-22 DIAGNOSIS — N83209 Unspecified ovarian cyst, unspecified side: Secondary | ICD-10-CM

## 2011-10-22 DIAGNOSIS — N946 Dysmenorrhea, unspecified: Secondary | ICD-10-CM | POA: Insufficient documentation

## 2011-10-22 DIAGNOSIS — N920 Excessive and frequent menstruation with regular cycle: Secondary | ICD-10-CM

## 2011-10-22 DIAGNOSIS — N83201 Unspecified ovarian cyst, right side: Secondary | ICD-10-CM

## 2011-10-22 DIAGNOSIS — N949 Unspecified condition associated with female genital organs and menstrual cycle: Secondary | ICD-10-CM

## 2011-10-22 DIAGNOSIS — N839 Noninflammatory disorder of ovary, fallopian tube and broad ligament, unspecified: Secondary | ICD-10-CM

## 2011-10-22 DIAGNOSIS — D259 Leiomyoma of uterus, unspecified: Secondary | ICD-10-CM | POA: Insufficient documentation

## 2011-10-22 DIAGNOSIS — N831 Corpus luteum cyst of ovary, unspecified side: Secondary | ICD-10-CM

## 2011-10-22 NOTE — Progress Notes (Signed)
Stacy Meyer is an 51 y.o. female. Gravida 2 para 2 (vaginal deliveries ) who presented to the office today for scheduled sonohysterogram and endometrial biopsy as a result of her menorrhagia. Patient has had anemia last hemoglobin was 11.9 she's on iron supplementation. She had been on Depo-Provera 150 mg every 3 months until January 2012 and then was switched to Micronor. Patient had a TSH and prolactin and sonohysterogram in 2000 and all were normal.  Ultrasound report today: Uterus measured 11.3 x 6.6 x 5.5 cm endometrial stripe 6.4 mm. Uterus anteverted with left intramural myoma measuring 45 a 40 x 37 mm. Right ovary had a thinwall echo-free cyst measuring 40 x 30 x 30 mm. Negative color flow. Left ovary follicles were noted one measuring 19 x 40 mm. Fluid left eye adnexal wall of left ovary was noted. Sonohysterogram no intracavitary defect.  Pertinent Gynecological History: Menses: Patient bleeds for 5-7 days and heavy with lots of cramps Bleeding: As above Contraception: tubal ligation DES exposure: denies Blood transfusions: none Sexually transmitted diseases: no past history Previous GYN Procedures: 2 normal spontaneous vaginal deliveries and one laparoscopic tubal ligation  Last mammogram: normal Date: 2013 Last pap: normal Date: 2013 OB History: G 2, P to   Menstrual History: Menarche age: 67 Patient's last menstrual period was 09/20/2011.    Past Medical History  Diagnosis Date  . Anemia 02/2009    SEVERE  . Asthma     Past Surgical History  Procedure Date  . Tubal ligation 1988  . Carpal tunnel release   . Excision of ganglion cyst     Family History  Problem Relation Age of Onset  . Breast cancer Mother   . Diabetes Father   . Hypertension Father   . Heart disease Father   . Breast cancer Sister     OVARIAN CA    Social History:  reports that she has never smoked. She has never used smokeless tobacco. She reports that she drinks alcohol. She reports  that she does not use illicit drugs.  Allergies:  Allergies  Allergen Reactions  . Other     HAY FEVER     (Not in a hospital admission)  @ROS @  Last menstrual period 09/20/2011.  Physical Exam:  HEENT:unremarkable Neck:Supple, midline, no thyroid megaly, no carotid bruits Lungs:  Clear to auscultation no rhonchi's or wheezes Heart:Regular rate and rhythm, no murmurs or gallops Breast Exam: Not examined Abdomen: Pendulous soft nontender no rebound guarding Pelvic:BUS within normal limits Vagina: No gross lesions on inspection Cervix: No gross lesions on inspection Uterus: Anteverted 10-12 weeks size Adnexa: Difficult to assess due to patient's abdominal girth Extremities: No cords, no edema Rectal: Not done  Patient was counseled for an endometrial biopsy. The cervix was cleansed with Betadine solution. Single-tooth tenaculum was placed on the anterior cervical lip. The uterus sounded to 7 cm. A Pipelle was introduced into the intrauterine cavity and tissue was obtained and submitted for histological evaluation.  Assessment/Plan: 51 year old gravida 2 para 2 with 2 vaginal deliveries and past laparoscopic tubal ligation with complaints of menorrhagia dysmenorrhea over the course of the past 2 years with minimal resolution with Depo-Provera or now Microgestin. She's currently on iron supplementation last hemoglobin was 11.9. We discussed about endometrial ablation versus hysterectomy she would like to be done with it. We discussed about scheduling a total laparoscopic hysterectomy with ovarian cystectomy and ovarian conservation. The risks benefits and pros and cons of the operation were discussed with the patient  to include the following: The risk of DVT and subsequent pulmonary embolism and for this reason she will have PSA stockings for prophylaxis. This also the risk of hemorrhage and potential need for blood transfusion with this risk of hepatitis AIDS and anaphylactic  reaction. Also to prevent infection she'll receive antibiotic. There is also be potential risk of injury or trauma to internal organs requiring open laparotomy to correct the defect and patient may need to be in hospital additional days. Also in the event of any technical difficulty that the procedures is unable to be completed laparoscopically a laparotomy technique may need to be utilized. All the above were discussed in detail and literature formation was provided all questions were answered and we'll schedule accordingly. Will notify patient is as a result of the endometrial biopsy become available.  Ok Edwards 10/22/2011, 12:31 PM

## 2011-10-23 ENCOUNTER — Other Ambulatory Visit: Payer: Self-pay

## 2011-10-23 ENCOUNTER — Telehealth: Payer: Self-pay

## 2011-10-23 DIAGNOSIS — N920 Excessive and frequent menstruation with regular cycle: Secondary | ICD-10-CM

## 2011-10-23 NOTE — Telephone Encounter (Signed)
I called patient to schedule surgery.  I explained to her that Dr. Lily Peer decided to schedule it as Vaginal Hysterectomy possible [facility].  Therefore, he does want to see her a week or so before surgery to check an u/s.  We scheduled her surgery for April 29 7:30am at [facility] and her preop u/s-visit w JF for Monday, April 22 at 4:00pm at [facility].  She was instructed to continue her iron daily and also, reminded that she needs to see her primary care and get medical clearance for surgery regarding her asthma.  She will have disability forms and we discussed how long out of work. She said her job does require some lifting, pushing and pulling and she does sit some but is up and down.  I told her to initially plan for four weeks. She said her job could make her light duty and I told her in that event at her 2 week p.op check Dr. Glenetta Hew could assess how she her recovery is going and if he feels acceptable she may be able to return light duty before four weeks but we will just have to wait and see. She will get her disability forms to me when she can.

## 2011-10-27 ENCOUNTER — Encounter: Payer: Self-pay | Admitting: Gynecology

## 2011-10-28 ENCOUNTER — Telehealth: Payer: Self-pay | Admitting: *Deleted

## 2011-10-28 MED ORDER — MEGESTROL ACETATE 40 MG PO TABS: ORAL_TABLET | ORAL | Status: DC

## 2011-10-28 NOTE — Telephone Encounter (Signed)
(  pt is aware Stacy Meyer is out of the office) Pt called c/o bleeding LMP:10/26/11 bleeding is medium flow. Pt was told to follow if bleeding should start again, given provera 10 mg for bleeding on 09/24/11 by Stacy Meyer. Pt is scheduled to have hysterectomy on 12/01/11. Please advise

## 2011-10-28 NOTE — Telephone Encounter (Signed)
Please: Megace 40 mg to take 1 tablet by mouth 3 times a day for 3 days to stop her bleeding then take 1 tablet twice a day for 3 weeks.

## 2011-10-28 NOTE — Telephone Encounter (Signed)
Also reminded patient to stay on iron tablet one tablet twice a day.

## 2011-10-28 NOTE — Telephone Encounter (Signed)
Pt informed with all the below note, rx sent to pharmacy. 

## 2011-11-05 ENCOUNTER — Encounter (HOSPITAL_COMMUNITY): Payer: Self-pay | Admitting: Pharmacist

## 2011-11-14 ENCOUNTER — Ambulatory Visit (INDEPENDENT_AMBULATORY_CARE_PROVIDER_SITE_OTHER): Payer: PRIVATE HEALTH INSURANCE | Admitting: Family Medicine

## 2011-11-14 ENCOUNTER — Encounter: Payer: Self-pay | Admitting: Family Medicine

## 2011-11-14 ENCOUNTER — Encounter: Payer: PRIVATE HEALTH INSURANCE | Admitting: Family Medicine

## 2011-11-14 ENCOUNTER — Ambulatory Visit (INDEPENDENT_AMBULATORY_CARE_PROVIDER_SITE_OTHER)
Admission: RE | Admit: 2011-11-14 | Discharge: 2011-11-14 | Disposition: A | Discharge status: Home / Self Care | Payer: PRIVATE HEALTH INSURANCE | Source: Ambulatory Visit | Attending: Family Medicine | Admitting: Family Medicine

## 2011-11-14 VITALS — BP 120/72 | HR 76 | Temp 99.0°F | Ht 64.0 in | Wt 327.4 lb

## 2011-11-14 DIAGNOSIS — E669 Obesity, unspecified: Secondary | ICD-10-CM

## 2011-11-14 DIAGNOSIS — J45909 Unspecified asthma, uncomplicated: Secondary | ICD-10-CM

## 2011-11-14 DIAGNOSIS — R030 Elevated blood-pressure reading, without diagnosis of hypertension: Secondary | ICD-10-CM

## 2011-11-14 DIAGNOSIS — Z01818 Encounter for other preprocedural examination: Secondary | ICD-10-CM

## 2011-11-14 DIAGNOSIS — D259 Leiomyoma of uterus, unspecified: Secondary | ICD-10-CM

## 2011-11-14 DIAGNOSIS — D219 Benign neoplasm of connective and other soft tissue, unspecified: Secondary | ICD-10-CM

## 2011-11-14 DIAGNOSIS — D649 Anemia, unspecified: Secondary | ICD-10-CM

## 2011-11-14 MED ORDER — ALBUTEROL SULFATE HFA 108 (90 BASE) MCG/ACT IN AERS: 2.0000 | INHALATION_SPRAY | Freq: Four times a day (QID) | RESPIRATORY_TRACT | Status: DC | PRN

## 2011-11-14 MED ORDER — FLUTICASONE-SALMETEROL 100-50 MCG/DOSE IN AEPB: 1.0000 | INHALATION_SPRAY | Freq: Two times a day (BID) | RESPIRATORY_TRACT | Status: DC

## 2011-11-14 NOTE — Patient Instructions (Signed)
Xray today. Use advair twice daily starting today. Should be cleared for surgery.

## 2011-11-14 NOTE — Progress Notes (Signed)
Subjective:    Patient ID: Stacy Meyer, female    DOB: 04-20-1961, 51 y.o.   MRN: [id number]  HPI CC: surgical clearance, transfer from Dr. Hetty Ely.  Pleasant 51 yo with h/o obesity, asthma, menorrhagia with fibroids presents for surgical clearance.  Planning on vaginal hysterectomy 12/01/2011 by Dr. Lily Peer in [facility] indication heavy periods and 2 fibriods, severe anemia.  Planned to keep ovaries.  fmhx ovarian cancer.  Asthma - lifelong, but currently stable according to pt.  Last used albuterol inhaler on Wednesday, uses as needed but not a daily basis.  No recent hospitalizations or ER visits for asthma.  Not currently taking advair - states has backed off over last several months because didn't feel needed.  H/o allergic rhinitis - takes OTC zyrtec.  Worse in summer.  Not currently bothering her.  H/o CTS and BTL in past but unsure if used GETA.  No h/o adverse reaction to anesthesia, but during CTS surgery did awaken early, and during BTL felt oversedated - sleepy for 2 d after surgery.  No smoking, no regular EtOH (only social).  Activity wise - no recent regular activity because feels like has no energy and scared to do excess activity be cause scared may start bleeding vaginally which has happened in past. Denies chest pain/tightness, HA, dyspnea (starts getting SOB after 1 lap around local mall, but does feel she is some deconditioned as used to be able to do several laps).  Last tetanus (Td) was 2 yrs ago at [facility].  NKDA  Lab Results  Component Value Date   HGB 11.9* 09/24/2011   Lab Results  Component Value Date   CREATININE 0.8 09/10/2006   Medications and allergies reviewed and updated in chart.  Past histories reviewed and updated if relevant as below. Patient Active Problem List  Diagnoses  . ONYCHOMYCOSIS, BILATERAL  . ALLERGIC RHINITIS CAUSE UNSPECIFIED  . ASTHMA  . ELEVATED BLOOD PRESSURE WITHOUT DIAGNOSIS OF HYPERTENSION  . OBESITY  . Anemia  .  Asthma  . Fibroids  . Fibroid uterus  . Menorrhagia  . Dysmenorrhea   Past Medical History  Diagnosis Date  . Anemia 02/2009    SEVERE  . Asthma   . Morbid obesity    Past Surgical History  Procedure Date  . Tubal ligation 1988  . Carpal tunnel release   . Excision of ganglion cyst    History  Substance Use Topics  . Smoking status: Never Smoker   . Smokeless tobacco: Never Used  . Alcohol Use: Yes   Family History  Problem Relation Age of Onset  . Breast cancer Mother   . Diabetes Father   . Hypertension Father   . Heart disease Father   . Breast cancer Sister     OVARIAN CA   Allergies  Allergen Reactions  . Other     HAY FEVER   Current Outpatient Prescriptions on File Prior to Visit  Medication Sig Dispense Refill  . albuterol (PROAIR HFA) 108 (90 BASE) MCG/ACT inhaler Inhale 2 puffs into the lungs every 6 (six) hours as needed. For wheezing.      . megestrol (MEGACE) 40 MG tablet Take 1 tablet by mouth 3 times a day for 3 days to stop her bleeding then take 1 tablet twice a day for 3 weeks.  30 tablet  0    Review of Systems Per HPI    Objective:   Physical Exam  Nursing note and vitals reviewed. Constitutional: She appears well-developed  and well-nourished. No distress.       Morbid obesity  HENT:  Head: Normocephalic and atraumatic.  Mouth/Throat: Oropharynx is clear and moist. No oropharyngeal exudate.  Eyes: Conjunctivae and EOM are normal. Pupils are equal, round, and reactive to light. No scleral icterus.  Neck: Normal range of motion. Neck supple. Carotid bruit is not present.  Cardiovascular: Normal rate, regular rhythm, normal heart sounds and intact distal pulses.   No murmur heard. Pulmonary/Chest: Effort normal and breath sounds normal. No respiratory distress. She has no wheezes. She has no rales.  Musculoskeletal: She exhibits edema (tr pedal edema).  Lymphadenopathy:    She has no cervical adenopathy.  Skin: Skin is warm and dry. No  rash noted.  Psychiatric: She has a normal mood and affect.       Assessment & Plan:

## 2011-11-15 ENCOUNTER — Encounter: Payer: Self-pay | Admitting: Family Medicine

## 2011-11-15 DIAGNOSIS — Z01818 Encounter for other preprocedural examination: Secondary | ICD-10-CM | POA: Insufficient documentation

## 2011-11-15 LAB — CBC WITH DIFFERENTIAL/PLATELET
Basophils Absolute: 0.1 10*3/uL (ref 0.0–0.1)
Basophils Relative: 1 % (ref 0–1)
Hemoglobin: 12.3 g/dL (ref 12.0–15.0)
MCHC: 31.5 g/dL (ref 30.0–36.0)
Monocytes Relative: 9 % (ref 3–12)
Neutro Abs: 2.9 10*3/uL (ref 1.7–7.7)
Neutrophils Relative %: 43 % (ref 43–77)
RDW: 14.2 % (ref 11.5–15.5)

## 2011-11-15 LAB — BASIC METABOLIC PANEL
CO2: 26 mEq/L (ref 19–32)
Glucose, Bld: 86 mg/dL (ref 70–99)
Potassium: 4.1 mEq/L (ref 3.5–5.3)
Sodium: 139 mEq/L (ref 135–145)

## 2011-11-15 NOTE — Assessment & Plan Note (Signed)
Check CBC 

## 2011-11-15 NOTE — Assessment & Plan Note (Signed)
See above - start advair perioperatively.

## 2011-11-15 NOTE — Assessment & Plan Note (Signed)
Discussed optimizing health prior to surgery:  1. recommended increased activity starting now - restart regular walking. 2. recommended start regular advair at 1 puff bid perioperatively to minimize albuterol use. Briefly discussed risks of surgery.  Has tolerated anesthesia in past.  Low cardiac risk pt, intermediate risk surgery.  Should tolerate surgery fine.  CXR given obesity - clear.  ?DISH EKG- NSR at rate 67, normal axis, intervals, no hypertrophy or acute ST/T changes.  No old to compare.

## 2011-11-15 NOTE — Assessment & Plan Note (Signed)
Encouraged regular exercise for goal weight loss.

## 2011-11-15 NOTE — Assessment & Plan Note (Signed)
BP Readings from Last 3 Encounters:  11/14/11 120/72  09/24/11 120/78  09/25/10 128/78

## 2011-11-19 ENCOUNTER — Encounter (HOSPITAL_COMMUNITY)
Admission: RE | Admit: 2011-11-19 | Discharge: 2011-11-19 | Disposition: A | Discharge status: Home / Self Care | Payer: PRIVATE HEALTH INSURANCE | Source: Ambulatory Visit | Attending: Gynecology | Admitting: Gynecology

## 2011-11-19 ENCOUNTER — Encounter (HOSPITAL_COMMUNITY): Payer: Self-pay

## 2011-11-19 HISTORY — DX: Encounter for other specified aftercare: Z51.89

## 2011-11-19 LAB — CBC
Hemoglobin: 13.3 g/dL (ref 12.0–15.0)
MCH: 27.7 pg (ref 26.0–34.0)
MCV: 85.7 fL (ref 78.0–100.0)
Platelets: 345 10*3/uL (ref 150–400)
RBC: 4.81 MIL/uL (ref 3.87–5.11)
WBC: 6.9 10*3/uL (ref 4.0–10.5)

## 2011-11-19 NOTE — Patient Instructions (Signed)
   Your procedure is scheduled on: Monday April 29th  Enter through the Main Entrance of [facility] at: 6am Pick up the phone at the desk and dial [phone] and inform us of your arrival.  Please call this number if you have any problems the morning of surgery: [phone]  Remember: Do not eat food after midnight: Sunday Do not drink clear liquids after:midnight sunday Take these medicines the morning of surgery with a SIP OF WATER:none Bring inhalers with you day of surgery  Do not wear jewelry, make-up, or FINGER nail polish Do not wear lotions, powders, perfumes or deodorant. Do not shave 48 hours prior to surgery. Do not bring valuables to the hospital. Contacts, dentures or bridgework may not be worn into surgery.  Leave suitcase in the car. After Surgery it may be brought to your room. For patients being admitted to the hospital, checkout time is 11:00am the day of discharge.  Patients discharged on the day of surgery will not be allowed to drive home.     Remember to use your hibiclens as instructed.Please shower with 1/2 bottle the evening before your surgery and the other 1/2 bottle the morning of surgery. Neck down avoiding private area.

## 2011-11-21 ENCOUNTER — Ambulatory Visit (INDEPENDENT_AMBULATORY_CARE_PROVIDER_SITE_OTHER): Payer: PRIVATE HEALTH INSURANCE | Admitting: Gynecology

## 2011-11-21 ENCOUNTER — Ambulatory Visit (INDEPENDENT_AMBULATORY_CARE_PROVIDER_SITE_OTHER): Payer: PRIVATE HEALTH INSURANCE

## 2011-11-21 ENCOUNTER — Encounter: Payer: Self-pay | Admitting: Gynecology

## 2011-11-21 DIAGNOSIS — N83209 Unspecified ovarian cyst, unspecified side: Secondary | ICD-10-CM

## 2011-11-21 DIAGNOSIS — D259 Leiomyoma of uterus, unspecified: Secondary | ICD-10-CM

## 2011-11-21 DIAGNOSIS — N946 Dysmenorrhea, unspecified: Secondary | ICD-10-CM

## 2011-11-21 DIAGNOSIS — N920 Excessive and frequent menstruation with regular cycle: Secondary | ICD-10-CM

## 2011-11-21 DIAGNOSIS — Z01810 Encounter for preprocedural cardiovascular examination: Secondary | ICD-10-CM

## 2011-11-21 NOTE — Progress Notes (Signed)
Patient is a 51 year old gravida 2 para 2 (vaginal deliveries) who presented today for preoperative consultation for planned transvaginal hysterectomy as a result of her dysmenorrhea, menorrhagia, and fibroid uterus. Patient was seen in the office on 10/22/2011 for schedule sonohysterogram as well as an endometrial biopsy as a result of her menorrhagia. She has had a low hemoglobin of 11.9 and has currently been on iron supplementation every other day. Patient had a normal TSH and prolactin as well as a sonohysterogram in the year 2000. Patient had been on Depo-Provera every 3 months until January 2012 and then was switched to Micronor which regulate her cycles somewhat. Ultrasound here in the office on March 20 as follows:   Ultrasound report today: Uterus measured 11.3 x 6.6 x 5.5 cm endometrial stripe 6.4 mm. Uterus anteverted with left intramural myoma measuring 45 a 40 x 37 mm. Right ovary had a thinwall echo-free cyst measuring 40 x 30 x 30 mm. Negative color flow. Left ovary follicles were noted one measuring 19 x 40 mm. Fluid left eye adnexal wall of left ovary was noted. Sonohysterogram no intracavitary defect  Her followup ultrasound today demonstrated a uterus that measured 9.5 x 6.2 x 5.1 cm with an endometrial stripe of 9.7 mm she had a single left fibroid measuring 3.8 x 3.5 cm complete resolution of previously seen ovarian cyst otherwise normal ovaries and no free fluid in the cul-de-sac.  Pertinent Gynecological History:  Menses: Patient bleeds for 5-49 days and heavy with lots of cramps  Bleeding: As above  Contraception: tubal ligation  DES exposure: denies  Blood transfusions: none  Sexually transmitted diseases: no past history  Previous GYN Procedures: 2 normal spontaneous vaginal deliveries and one laparoscopic tubal ligation  Last mammogram: normal Date: 2013  Last pap: normal Date: 2013  OB History: G 2, P2  Menstrual History:  Menarche age: 43  Patient's last menstrual  period was 09/20/2011.   Past Medical History   Diagnosis  Date   .  Anemia  02/2009     SEVERE   .  Asthma     Past Surgical History   Procedure  Date   .  Tubal ligation  1988   .  Carpal tunnel release    .  Excision of ganglion cyst     Family History   Problem  Relation  Age of Onset   .  Breast cancer  Mother    .  Diabetes  Father    .  Hypertension  Father    .  Heart disease  Father    .  Breast cancer  Sister       OVARIAN CA sister    Social History: reports that she has never smoked. She has never used smokeless tobacco. She reports that she drinks alcohol. She reports that she does not use illicit drugs.  Allergies:  Allergies   Allergen  Reactions   .  Other      HAY FEVER     Review of systems: History of asthma, dysmenorrhea, menorrhagia, iron deficiency anemia, fibroid uterus, obesity  Physical Exam:  HEENT:unremarkable  Neck:Supple, midline, no thyroid megaly, no carotid bruits  Lungs: Clear to auscultation no rhonchi's or wheezes  Heart:Regular rate and rhythm, no murmurs or gallops  Breast Exam: Not examined  Abdomen: Pendulous soft nontender no rebound guarding  Pelvic:BUS within normal limits  Vagina: No gross lesions on inspection  Cervix: No gross lesions on inspection  Uterus: Anteverted 10-12 weeks size  Adnexa: Difficult to assess due to patient's abdominal girth  Extremities: No cords, no edema  Rectal: Not done    assessment/plan: 51 year old gravida 2 para 2 with refractory menorrhagia and iron deficiency anemia attributed to fibroid uterus. Patient scheduled to undergo transvaginal hysterectomy with ovarian conservation April 29 at [facility] hospital. Patient stated that she had a sister that died of ovarian cancer at the age of 51 but regardless she wishes to keep her ovaries. Due to patient's asthma and borderline hypertension her internist was consulted for medical clearance and stated that she was not in a high risk category but did  recommend increase her activity with walking prior to her surgery. He also did recommend starting regula Advair 1 puff twice a day perioperatively to minimize albuterol use. This was shared with the patient. The following risks were discussed as well:                        Patient was counseled as to the risk of surgery to include the following:  1. Infection (prohylactic antibiotics will be administered)  2. DVT/Pulmonary Embolism (prophylactic pneumo compression stockings will be used)  3.Trauma to internal organs requiring additional surgical procedure to repair any injury to     Internal organs requiring perhaps additional hospitalization days.  4.Hemmorhage requiring transfusion and blood products which carry risks such as             anaphylactic reaction, hepatitis and AIDS  Patient had received literature information on the procedure scheduled and all her questions were answered in her native tongue and accepts all risk.  All the above were discussed with the patient in detail all questions were answered and we'll follow accordingly.  [facility] HMD5:21 PMTD@

## 2011-11-21 NOTE — Patient Instructions (Addendum)
General Instructions for Surgery   These instructions are generic. They cover multiple surgeries and are a general pre-surgical guideline. They do not apply to all procedures. You may have questions. Answers will be provided by your caregiver. PREPARING FOR SURGERY  Stop smoking at least two weeks prior to surgery. This lowers risk during surgery. Ask your caregiver for help with this if needed. The benefits are well worth it. This is a good time for a healthy lifestyle change.   Your caregiver may advise that you stop taking certain medications that may affect the outcome of the surgery and your ability to heal. For example, you may need to stop taking anti-inflammatories, such as aspirin or ibuprofen because of possible bleeding problems. Other medications may have interactions with anesthesia.   BE SURE TO LET YOUR CAREGIVER KNOW IF YOU HAVE BEEN ON STEROIDS FOR LONG PERIODS OF TIME. THIS IS CRITICAL.   Your caregiver will discuss possible risks and complications with you before surgery. In addition to the usual risks of anesthesia, other common risks and complications include blood loss and replacement (not applicable to minor surgical procedures), temporary increase in pain due to surgery, uncorrected pain or problems the surgery was meant to correct, infection, or new damage.  BEFORE SURGERY Arrive to hospital prior to your procedure or as your caregiver suggests. Check in at the admissions desk to fill out necessary forms if you are not pre-registered. There will be consent forms to sign prior to the procedure. There is a waiting area for your family while you are having your procedure.  LET YOUR CAREGIVERS KNOW ABOUT THE FOLLOWING:  Allergies.  Medications taken including herbs, eye drops, over the counter medications, and creams.   Use of steroids (by mouth or creams).   Previous problems with anesthetics or novocaine.   Possibility of pregnancy, if this applies.  History of blood  clots (thrombophlebitis).   History of bleeding or blood problems.   Previous surgery.   Other health problems.   FOLLOWING SURGERY You will be taken to the recovery area where a nurse will watch and check your progress. Once you are awake, stable, and taking fluids well, barring other problems you will be allowed to go home. Once home, an ice pack wrapped in a light towel applied to your operative site may help with discomfort and keep the swelling down. Follow instructions as suggested by your caregiver. HOME CARE INSTRUCTIONS  Follow your caregiver's instructions as to activities, exercises, physical therapy, or driving a car.   Weight reduction may be helpful depending upon the type of surgery.   Daily exercise is helpful. Maintain strength and range of motion as instructed.   Only take over-the-counter or prescription medicines for pain, discomfort, or fever as directed by your caregiver.  SEEK MEDICAL CARE IF:  You notice increased bleeding (more than a small spot) from the wound.   You soak more than four pads following a uterine procedure unless instructed otherwise.   There is redness, swelling, or increasing pain in the wound.   You notice pus coming from wound.   An unexplained oral temperature over 100 develops, or as your caregiver suggests.   You notice a foul smell coming from the wound or dressing.   You become light headed or if you pass out (faint).  SEEK IMMEDIATE MEDICAL CARE IF:  You develop a rash.   You have difficulty breathing.   You develop any allergic problems.  Document Released: 10/27/2000 Document Re-Released: 10/17/2008 ExitCare  Patient Information 2011 [place], [place].   [facility] HMD4:59 PMTD@

## 2011-11-24 ENCOUNTER — Ambulatory Visit: Payer: PRIVATE HEALTH INSURANCE | Admitting: Gynecology

## 2011-11-24 ENCOUNTER — Other Ambulatory Visit: Payer: PRIVATE HEALTH INSURANCE

## 2011-11-30 MED ORDER — CEFAZOLIN SODIUM-DEXTROSE 2-3 GM-% IV SOLR
2.0000 g | INTRAVENOUS | Status: AC
  Administered 2011-12-01: 2 g via INTRAVENOUS
  Filled 2011-11-30: qty 50

## 2011-12-01 ENCOUNTER — Encounter (HOSPITAL_COMMUNITY)
Admission: RE | Disposition: A | Discharge status: Home / Self Care | Payer: Self-pay | Source: Ambulatory Visit | Attending: Gynecology

## 2011-12-01 ENCOUNTER — Encounter (HOSPITAL_COMMUNITY): Payer: Self-pay | Admitting: Anesthesiology

## 2011-12-01 ENCOUNTER — Ambulatory Visit (HOSPITAL_COMMUNITY): Payer: PRIVATE HEALTH INSURANCE | Admitting: Anesthesiology

## 2011-12-01 ENCOUNTER — Encounter (HOSPITAL_COMMUNITY): Payer: Self-pay | Admitting: *Deleted

## 2011-12-01 ENCOUNTER — Ambulatory Visit (HOSPITAL_COMMUNITY)
Admission: RE | Admit: 2011-12-01 | Discharge: 2011-12-02 | Disposition: A | Discharge status: Home / Self Care | Payer: PRIVATE HEALTH INSURANCE | Source: Ambulatory Visit | Attending: Gynecology | Admitting: Gynecology

## 2011-12-01 DIAGNOSIS — N83209 Unspecified ovarian cyst, unspecified side: Secondary | ICD-10-CM

## 2011-12-01 DIAGNOSIS — R030 Elevated blood-pressure reading, without diagnosis of hypertension: Secondary | ICD-10-CM

## 2011-12-01 DIAGNOSIS — N946 Dysmenorrhea, unspecified: Secondary | ICD-10-CM | POA: Insufficient documentation

## 2011-12-01 DIAGNOSIS — N8000 Endometriosis of the uterus, unspecified: Secondary | ICD-10-CM | POA: Insufficient documentation

## 2011-12-01 DIAGNOSIS — Z01818 Encounter for other preprocedural examination: Secondary | ICD-10-CM | POA: Insufficient documentation

## 2011-12-01 DIAGNOSIS — B351 Tinea unguium: Secondary | ICD-10-CM

## 2011-12-01 DIAGNOSIS — D259 Leiomyoma of uterus, unspecified: Secondary | ICD-10-CM

## 2011-12-01 DIAGNOSIS — N920 Excessive and frequent menstruation with regular cycle: Secondary | ICD-10-CM

## 2011-12-01 DIAGNOSIS — D251 Intramural leiomyoma of uterus: Secondary | ICD-10-CM | POA: Insufficient documentation

## 2011-12-01 DIAGNOSIS — Z01812 Encounter for preprocedural laboratory examination: Secondary | ICD-10-CM | POA: Insufficient documentation

## 2011-12-01 DIAGNOSIS — J309 Allergic rhinitis, unspecified: Secondary | ICD-10-CM

## 2011-12-01 DIAGNOSIS — K668 Other specified disorders of peritoneum: Secondary | ICD-10-CM | POA: Insufficient documentation

## 2011-12-01 DIAGNOSIS — D649 Anemia, unspecified: Secondary | ICD-10-CM | POA: Insufficient documentation

## 2011-12-01 HISTORY — PX: VAGINAL HYSTERECTOMY: SHX2639

## 2011-12-01 LAB — URINALYSIS, ROUTINE W REFLEX MICROSCOPIC
Glucose, UA: NEGATIVE mg/dL
pH: 6.5 (ref 5.0–8.0)

## 2011-12-01 LAB — URINE MICROSCOPIC-ADD ON

## 2011-12-01 LAB — PREGNANCY, URINE: Preg Test, Ur: NEGATIVE

## 2011-12-01 SURGERY — HYSTERECTOMY, VAGINAL
Anesthesia: General | Site: Vagina | Laterality: Right | Wound class: Clean Contaminated

## 2011-12-01 MED ORDER — NEOSTIGMINE METHYLSULFATE 1 MG/ML IJ SOLN
INTRAMUSCULAR | Status: DC | PRN
  Administered 2011-12-01: 3 mg via INTRAVENOUS

## 2011-12-01 MED ORDER — ONDANSETRON HCL 4 MG/2ML IJ SOLN: 4.0000 mg | Freq: Four times a day (QID) | INTRAMUSCULAR | Status: DC | PRN

## 2011-12-01 MED ORDER — NALOXONE HCL 0.4 MG/ML IJ SOLN: 0.4000 mg | INTRAMUSCULAR | Status: DC | PRN

## 2011-12-01 MED ORDER — ONDANSETRON HCL 4 MG/2ML IJ SOLN
INTRAMUSCULAR | Status: DC | PRN
  Administered 2011-12-01: 4 mg via INTRAVENOUS

## 2011-12-01 MED ORDER — NEOSTIGMINE METHYLSULFATE 1 MG/ML IJ SOLN
INTRAMUSCULAR | Status: AC
  Filled 2011-12-01: qty 10

## 2011-12-01 MED ORDER — ONDANSETRON HCL 4 MG/2ML IJ SOLN
INTRAMUSCULAR | Status: AC
  Filled 2011-12-01: qty 4

## 2011-12-01 MED ORDER — SUCCINYLCHOLINE CHLORIDE 20 MG/ML IJ SOLN
INTRAMUSCULAR | Status: DC | PRN
  Administered 2011-12-01: 14 mg via INTRAVENOUS

## 2011-12-01 MED ORDER — PROPOFOL 10 MG/ML IV EMUL
INTRAVENOUS | Status: DC | PRN
  Administered 2011-12-01: 200 mg via INTRAVENOUS

## 2011-12-01 MED ORDER — KETOROLAC TROMETHAMINE 30 MG/ML IJ SOLN
INTRAMUSCULAR | Status: AC
  Administered 2011-12-01: 30 mg via INTRAVENOUS
  Filled 2011-12-01: qty 1

## 2011-12-01 MED ORDER — FENTANYL CITRATE 0.05 MG/ML IJ SOLN
INTRAMUSCULAR | Status: AC
  Filled 2011-12-01: qty 10

## 2011-12-01 MED ORDER — MIDAZOLAM HCL 5 MG/5ML IJ SOLN
INTRAMUSCULAR | Status: DC | PRN
  Administered 2011-12-01: 2 mg via INTRAVENOUS

## 2011-12-01 MED ORDER — ALBUTEROL SULFATE HFA 108 (90 BASE) MCG/ACT IN AERS: 2.0000 | INHALATION_SPRAY | Freq: Four times a day (QID) | RESPIRATORY_TRACT | Status: DC | PRN

## 2011-12-01 MED ORDER — DEXAMETHASONE SODIUM PHOSPHATE 4 MG/ML IJ SOLN
INTRAMUSCULAR | Status: DC | PRN
  Administered 2011-12-01: 10 mg via INTRAVENOUS

## 2011-12-01 MED ORDER — BUPIVACAINE HCL (PF) 0.25 % IJ SOLN
INTRAMUSCULAR | Status: AC
  Filled 2011-12-01: qty 30

## 2011-12-01 MED ORDER — HYDROMORPHONE HCL PF 1 MG/ML IJ SOLN
INTRAMUSCULAR | Status: AC
  Administered 2011-12-01: 0.5 mg via INTRAVENOUS
  Filled 2011-12-01: qty 1

## 2011-12-01 MED ORDER — GLYCOPYRROLATE 0.2 MG/ML IJ SOLN
INTRAMUSCULAR | Status: DC | PRN
  Administered 2011-12-01: 0.4 mg via INTRAVENOUS

## 2011-12-01 MED ORDER — DIPHENHYDRAMINE HCL 12.5 MG/5ML PO ELIX: 12.5000 mg | ORAL_SOLUTION | Freq: Four times a day (QID) | ORAL | Status: DC | PRN

## 2011-12-01 MED ORDER — MORPHINE SULFATE (PF) 1 MG/ML IV SOLN
INTRAVENOUS | Status: DC
  Administered 2011-12-01: 1 mg via INTRAVENOUS
  Administered 2011-12-01: 12:00:00 via INTRAVENOUS
  Administered 2011-12-01: 4 mg via INTRAVENOUS
  Administered 2011-12-02: 2 mg via INTRAVENOUS
  Administered 2011-12-02: 4 mg via INTRAVENOUS
  Filled 2011-12-01: qty 25

## 2011-12-01 MED ORDER — ROCURONIUM BROMIDE 100 MG/10ML IV SOLN
INTRAVENOUS | Status: DC | PRN
  Administered 2011-12-01: 10 mg via INTRAVENOUS
  Administered 2011-12-01: 5 mg via INTRAVENOUS
  Administered 2011-12-01: 10 mg via INTRAVENOUS
  Administered 2011-12-01: 20 mg via INTRAVENOUS

## 2011-12-01 MED ORDER — ESTROGENS, CONJUGATED 0.625 MG/GM VA CREA
TOPICAL_CREAM | VAGINAL | Status: DC | PRN
  Administered 2011-12-01: 1 via VAGINAL

## 2011-12-01 MED ORDER — MEPERIDINE HCL 25 MG/ML IJ SOLN: 6.2500 mg | INTRAMUSCULAR | Status: DC | PRN

## 2011-12-01 MED ORDER — ROCURONIUM BROMIDE 50 MG/5ML IV SOLN
INTRAVENOUS | Status: AC
  Filled 2011-12-01: qty 1

## 2011-12-01 MED ORDER — MIDAZOLAM HCL 2 MG/2ML IJ SOLN
INTRAMUSCULAR | Status: AC
  Filled 2011-12-01: qty 2

## 2011-12-01 MED ORDER — DEXAMETHASONE SODIUM PHOSPHATE 10 MG/ML IJ SOLN
INTRAMUSCULAR | Status: AC
  Filled 2011-12-01: qty 1

## 2011-12-01 MED ORDER — METOCLOPRAMIDE HCL 5 MG/ML IJ SOLN: 10.0000 mg | Freq: Once | INTRAMUSCULAR | Status: DC | PRN

## 2011-12-01 MED ORDER — LIDOCAINE HCL (CARDIAC) 20 MG/ML IV SOLN
INTRAVENOUS | Status: AC
  Filled 2011-12-01: qty 5

## 2011-12-01 MED ORDER — LIDOCAINE HCL (CARDIAC) 20 MG/ML IV SOLN
INTRAVENOUS | Status: DC | PRN
  Administered 2011-12-01: 60 mg via INTRAVENOUS

## 2011-12-01 MED ORDER — SUCCINYLCHOLINE CHLORIDE 20 MG/ML IJ SOLN
INTRAMUSCULAR | Status: AC
  Filled 2011-12-01: qty 10

## 2011-12-01 MED ORDER — LACTATED RINGERS IV SOLN
INTRAVENOUS | Status: DC
  Administered 2011-12-01 (×3): via INTRAVENOUS

## 2011-12-01 MED ORDER — 0.9 % SODIUM CHLORIDE (POUR BTL) OPTIME
TOPICAL | Status: DC | PRN
  Administered 2011-12-01: 1000 mL

## 2011-12-01 MED ORDER — MORPHINE SULFATE (PF) 1 MG/ML IV SOLN: INTRAVENOUS | Status: DC

## 2011-12-01 MED ORDER — FENTANYL CITRATE 0.05 MG/ML IJ SOLN
INTRAMUSCULAR | Status: DC | PRN
  Administered 2011-12-01 (×2): 50 ug via INTRAVENOUS
  Administered 2011-12-01 (×4): 100 ug via INTRAVENOUS

## 2011-12-01 MED ORDER — GLYCOPYRROLATE 0.2 MG/ML IJ SOLN
INTRAMUSCULAR | Status: AC
  Filled 2011-12-01: qty 1

## 2011-12-01 MED ORDER — SODIUM CHLORIDE 0.9 % IJ SOLN: 9.0000 mL | INTRAMUSCULAR | Status: DC | PRN

## 2011-12-01 MED ORDER — OXYCODONE-ACETAMINOPHEN 5-325 MG PO TABS
1.0000 | ORAL_TABLET | Freq: Four times a day (QID) | ORAL | Status: DC | PRN
  Administered 2011-12-02 (×2): 1 via ORAL
  Filled 2011-12-01 (×2): qty 1

## 2011-12-01 MED ORDER — ESTRADIOL 0.1 MG/GM VA CREA
TOPICAL_CREAM | VAGINAL | Status: AC
  Filled 2011-12-01: qty 42.5

## 2011-12-01 MED ORDER — LIDOCAINE-EPINEPHRINE 1 %-1:100000 IJ SOLN
INTRAMUSCULAR | Status: DC | PRN
  Administered 2011-12-01: 30 mL

## 2011-12-01 MED ORDER — HYDROMORPHONE HCL PF 1 MG/ML IJ SOLN
0.2500 mg | INTRAMUSCULAR | Status: DC | PRN
  Administered 2011-12-01 (×4): 0.5 mg via INTRAVENOUS

## 2011-12-01 MED ORDER — DIPHENHYDRAMINE HCL 50 MG/ML IJ SOLN: 12.5000 mg | Freq: Four times a day (QID) | INTRAMUSCULAR | Status: DC | PRN

## 2011-12-01 MED ORDER — KETOROLAC TROMETHAMINE 30 MG/ML IJ SOLN
30.0000 mg | Freq: Once | INTRAMUSCULAR | Status: AC
  Administered 2011-12-01: 30 mg via INTRAVENOUS

## 2011-12-01 SURGICAL SUPPLY — 73 items
ADH SKN CLS APL DERMABOND .7 (GAUZE/BANDAGES/DRESSINGS)
BLADE SURG 15 STRL LF C SS BP (BLADE) ×3 IMPLANT
BLADE SURG 15 STRL SS (BLADE) ×4
CABLE HIGH FREQUENCY MONO STRZ (ELECTRODE) IMPLANT
CANISTER SUCTION 2500CC (MISCELLANEOUS) ×4 IMPLANT
CLOTH BEACON ORANGE TIMEOUT ST (SAFETY) ×4 IMPLANT
CONT PATH 16OZ SNAP LID 3702 (MISCELLANEOUS) IMPLANT
COVER MAYO STAND STRL (DRAPES) ×4 IMPLANT
COVER TABLE BACK 60X90 (DRAPES) ×4 IMPLANT
DECANTER SPIKE VIAL GLASS SM (MISCELLANEOUS) IMPLANT
DERMABOND ADVANCED (GAUZE/BANDAGES/DRESSINGS)
DERMABOND ADVANCED .7 DNX12 (GAUZE/BANDAGES/DRESSINGS) ×1 IMPLANT
DEVICE SUTURE ENDOST 10MM (ENDOMECHANICALS) IMPLANT
DISSECTOR BLUNT TIP ENDO 5MM (MISCELLANEOUS) IMPLANT
DRAPE STERI URO 9X17 APER PCH (DRAPES) ×4 IMPLANT
ENDOSTITCH 0 SINGLE 48 (SUTURE) IMPLANT
EVACUATOR SMOKE 8.L (FILTER) ×1 IMPLANT
GAUZE PACKING 2X5 YD STERILE (GAUZE/BANDAGES/DRESSINGS) ×3 IMPLANT
GLOVE BIO SURGEON STRL SZ 6 (GLOVE) ×3 IMPLANT
GLOVE BIO SURGEON STRL SZ 6.5 (GLOVE) ×3 IMPLANT
GLOVE BIO SURGEON STRL SZ7 (GLOVE) ×3 IMPLANT
GLOVE BIO SURGEON STRL SZ7.5 (GLOVE) ×4 IMPLANT
GLOVE BIOGEL PI IND STRL 6.5 (GLOVE) ×3 IMPLANT
GLOVE BIOGEL PI IND STRL 8 (GLOVE) ×3 IMPLANT
GLOVE BIOGEL PI INDICATOR 6.5 (GLOVE) ×1
GLOVE BIOGEL PI INDICATOR 8 (GLOVE) ×1
GLOVE ECLIPSE 7.5 STRL STRAW (GLOVE) ×8 IMPLANT
GLOVE INDICATOR 7.0 STRL GRN (GLOVE) ×3 IMPLANT
GOWN PREVENTION PLUS LG XLONG (DISPOSABLE) ×16 IMPLANT
GOWN STRL REIN XL XLG (GOWN DISPOSABLE) ×4 IMPLANT
NDL SPNL 18GX3.5 QUINCKE PK (NEEDLE) ×1 IMPLANT
NDL SPNL 22GX3.5 QUINCKE BK (NEEDLE) IMPLANT
NEEDLE HYPO 22GX1.5 SAFETY (NEEDLE) IMPLANT
NEEDLE MAYO .5 CIRCLE (NEEDLE) IMPLANT
NEEDLE SPNL 18GX3.5 QUINCKE PK (NEEDLE) ×4 IMPLANT
NEEDLE SPNL 22GX3.5 QUINCKE BK (NEEDLE) IMPLANT
NS IRRIG 1000ML POUR BTL (IV SOLUTION) ×4 IMPLANT
OCCLUDER COLPOPNEUMO (BALLOONS) IMPLANT
PACK LAVH (CUSTOM PROCEDURE TRAY) ×4 IMPLANT
PACK VAGINAL WOMENS (CUSTOM PROCEDURE TRAY) ×1 IMPLANT
SCALPEL HARMONIC ACE (MISCELLANEOUS) ×1 IMPLANT
SCISSORS LAP 5X35 DISP (ENDOMECHANICALS) IMPLANT
SEALER TISSUE X1 CVD JAW (INSTRUMENTS) IMPLANT
SET CYSTO W/LG BORE CLAMP LF (SET/KITS/TRAYS/PACK) IMPLANT
SET IRRIG TUBING LAPAROSCOPIC (IRRIGATION / IRRIGATOR) ×4 IMPLANT
SUT PLAIN 3 0 PS2 27 (SUTURE) IMPLANT
SUT VIC AB 0 CT1 18XCR BRD8 (SUTURE) ×9 IMPLANT
SUT VIC AB 0 CT1 27 (SUTURE) ×12
SUT VIC AB 0 CT1 27XBRD ANBCTR (SUTURE) ×7 IMPLANT
SUT VIC AB 0 CT1 8-18 (SUTURE) ×12
SUT VIC AB 2-0 SH 27 (SUTURE) ×4
SUT VIC AB 2-0 SH 27XBRD (SUTURE) ×3 IMPLANT
SUT VIC AB 2-0 UR5 27 (SUTURE) ×4 IMPLANT
SUT VIC AB 3-0 CT1 27 (SUTURE) ×4
SUT VIC AB 3-0 CT1 TAPERPNT 27 (SUTURE) ×2 IMPLANT
SUT VIC AB 3-0 SH 27 (SUTURE) ×4
SUT VIC AB 3-0 SH 27X BRD (SUTURE) ×2 IMPLANT
SUT VICRYL 0 TIES 12 18 (SUTURE) ×4 IMPLANT
SUT VICRYL 0 UR6 27IN ABS (SUTURE) ×4 IMPLANT
SUT VICRYL RAPIDE 3 0 (SUTURE) ×2 IMPLANT
SYR 50ML LL SCALE MARK (SYRINGE) ×1 IMPLANT
SYR BULB IRRIGATION 50ML (SYRINGE) ×4 IMPLANT
TIP UTERINE 5.1X6CM LAV DISP (MISCELLANEOUS) IMPLANT
TIP UTERINE 6.7X10CM GRN DISP (MISCELLANEOUS) IMPLANT
TIP UTERINE 6.7X6CM WHT DISP (MISCELLANEOUS) IMPLANT
TIP UTERINE 6.7X8CM BLUE DISP (MISCELLANEOUS) IMPLANT
TOWEL OR 17X24 6PK STRL BLUE (TOWEL DISPOSABLE) ×8 IMPLANT
TRAY FOLEY CATH 14FR (SET/KITS/TRAYS/PACK) ×4 IMPLANT
TROCAR BALLN 12MMX100 BLUNT (TROCAR) IMPLANT
TROCAR XCEL NON-BLD 11X100MML (ENDOMECHANICALS) ×2 IMPLANT
TROCAR XCEL NON-BLD 5MMX100MML (ENDOMECHANICALS) ×2 IMPLANT
WARMER LAPAROSCOPE (MISCELLANEOUS) ×4 IMPLANT
WATER STERILE IRR 1000ML POUR (IV SOLUTION) ×4 IMPLANT

## 2011-12-01 NOTE — H&P (View-Only) (Signed)
Patient is a 50-year-old gravida 2 para 2 (vaginal deliveries) who presented today for preoperative consultation for planned transvaginal hysterectomy as a result of her dysmenorrhea, menorrhagia, and fibroid uterus. Patient was seen in the office on 10/22/2011 for schedule sonohysterogram as well as an endometrial biopsy as a result of her menorrhagia. She has had a low hemoglobin of 11.9 and has currently been on iron supplementation every other day. Patient had a normal TSH and prolactin as well as a sonohysterogram in the year 2000. Patient had been on Depo-Provera every 3 months until January 2012 and then was switched to Micronor which regulate her cycles somewhat. Ultrasound here in the office on March 20 as follows:   Ultrasound report today: Uterus measured 11.3 x 6.6 x 5.5 cm endometrial stripe 6.4 mm. Uterus anteverted with left intramural myoma measuring 45 a 40 x 37 mm. Right ovary had a thinwall echo-free cyst measuring 40 x 30 x 30 mm. Negative color flow. Left ovary follicles were noted one measuring 19 x 40 mm. Fluid left eye adnexal wall of left ovary was noted. Sonohysterogram no intracavitary defect  Her followup ultrasound today demonstrated a uterus that measured 9.5 x 6.2 x 5.1 cm with an endometrial stripe of 9.7 mm she had a single left fibroid measuring 3.8 x 3.5 cm complete resolution of previously seen ovarian cyst otherwise normal ovaries and no free fluid in the cul-de-sac.  Pertinent Gynecological History:  Menses: Patient bleeds for 5-7 days and heavy with lots of cramps  Bleeding: As above  Contraception: tubal ligation  DES exposure: denies  Blood transfusions: none  Sexually transmitted diseases: no past history  Previous GYN Procedures: 2 normal spontaneous vaginal deliveries and one laparoscopic tubal ligation  Last mammogram: normal Date: 2013  Last pap: normal Date: 2013  OB History: G 2, P2  Menstrual History:  Menarche age: 14  Patient's last menstrual  period was 09/20/2011.   Past Medical History   Diagnosis  Date   .  Anemia  02/2009     SEVERE   .  Asthma     Past Surgical History   Procedure  Date   .  Tubal ligation  1988   .  Carpal tunnel release    .  Excision of ganglion cyst     Family History   Problem  Relation  Age of Onset   .  Breast cancer  Mother    .  Diabetes  Father    .  Hypertension  Father    .  Heart disease  Father    .  Breast cancer  Sister       OVARIAN CA sister    Social History: reports that she has never smoked. She has never used smokeless tobacco. She reports that she drinks alcohol. She reports that she does not use illicit drugs.  Allergies:  Allergies   Allergen  Reactions   .  Other      HAY FEVER     Review of systems: History of asthma, dysmenorrhea, menorrhagia, iron deficiency anemia, fibroid uterus, obesity  Physical Exam:  HEENT:unremarkable  Neck:Supple, midline, no thyroid megaly, no carotid bruits  Lungs: Clear to auscultation no rhonchi's or wheezes  Heart:Regular rate and rhythm, no murmurs or gallops  Breast Exam: Not examined  Abdomen: Pendulous soft nontender no rebound guarding  Pelvic:BUS within normal limits  Vagina: No gross lesions on inspection  Cervix: No gross lesions on inspection  Uterus: Anteverted 10-12 weeks size    Adnexa: Difficult to assess due to patient's abdominal girth  Extremities: No cords, no edema  Rectal: Not done    assessment/plan: 50-year-old gravida 2 para 2 with refractory menorrhagia and iron deficiency anemia attributed to fibroid uterus. Patient [facility] Patient stated that she had a sister that died of ovarian cancer at the age of 38 but regardless she wishes to keep her ovaries. Due to patient's asthma and borderline hypertension her internist was consulted for medical clearance and stated that she was not in a high risk category but did  recommend increase her activity with walking prior to her surgery. He also did recommend starting regula Advair 1 puff twice a day perioperatively to minimize albuterol use. This was shared with the patient. The following risks were discussed as well:                        Patient was counseled as to the risk of surgery to include the following:  1. Infection (prohylactic antibiotics will be administered)  2. DVT/Pulmonary Embolism (prophylactic pneumo compression stockings will be used)  3.Trauma to internal organs requiring additional surgical procedure to repair any injury to     Internal organs requiring perhaps additional hospitalization days.  4.Hemmorhage requiring transfusion and blood products which carry risks such as             anaphylactic reaction, hepatitis and AIDS  Patient had received literature information on the procedure scheduled and all her questions were answered in her native tongue and accepts all risk.  All the above were discussed with the patient in detail all questions were answered and we'll follow accordingly.  Aysa Larivee HMD5:21 PMTD@  

## 2011-12-01 NOTE — Interval H&P Note (Signed)
History and Physical Interval Note:  12/01/2011 7:09 AM  Stacy Meyer  has presented today for surgery, with the diagnosis of menorrhagia;dysmenorrhea;fibroid uterus;rt ovarian cyst  The various methods of treatment have been discussed with the patient and family. After consideration of risks, benefits and other options for treatment, the patient has consented to  Procedure(s) (LRB): HYSTERECTOMY VAGINAL  as a surgical intervention .  The patients' history has been reviewed, patient examined, no change in status, stable for surgery.  I have reviewed the patients' chart and labs.  Questions were answered to the patient's satisfaction.     Ok Edwards

## 2011-12-01 NOTE — Anesthesia Postprocedure Evaluation (Signed)
  Anesthesia Post-op Note  Patient: Stacy Meyer  Procedure(s) Performed: Procedure(s) (LRB): HYSTERECTOMY VAGINAL (Right) Patient is awake and responsive. Pain and nausea are reasonably well controlled. Vital signs are stable and clinically acceptable. Oxygen saturation is clinically acceptable. There are no apparent anesthetic complications at this time. Patient is ready for discharge.

## 2011-12-01 NOTE — Anesthesia Preprocedure Evaluation (Signed)
Anesthesia Evaluation  Patient identified by MRN, date of birth, ID band Patient awake    Reviewed: Allergy & Precautions, H&P , Patient's Chart, lab work & pertinent test results, reviewed documented beta blocker date and time   History of Anesthesia Complications Negative for: history of anesthetic complications  Airway Mallampati: III TM Distance: >3 FB Neck ROM: full    Dental No notable dental hx.    Pulmonary neg pulmonary ROS, asthma ,  breath sounds clear to auscultation  Pulmonary exam normal       Cardiovascular Exercise Tolerance: Good negative cardio ROS  Rhythm:regular Rate:Normal     Neuro/Psych negative neurological ROS  negative psych ROS   GI/Hepatic negative GI ROS, Neg liver ROS,   Endo/Other  negative endocrine ROSMorbid obesity  Renal/GU negative Renal ROS     Musculoskeletal   Abdominal   Peds  Hematology negative hematology ROS (+)   Anesthesia Other Findings   Reproductive/Obstetrics negative OB ROS                           Anesthesia Physical Anesthesia Plan  ASA: III  Anesthesia Plan: General ETT   Post-op Pain Management:    Induction:   Airway Management Planned:   Additional Equipment:   Intra-op Plan:   Post-operative Plan:   Informed Consent: I have reviewed the patients History and Physical, chart, labs and discussed the procedure including the risks, benefits and alternatives for the proposed anesthesia with the patient or authorized representative who has indicated his/her understanding and acceptance.   Dental Advisory Given  Plan Discussed with: CRNA and Surgeon  Anesthesia Plan Comments:         Anesthesia Quick Evaluation

## 2011-12-01 NOTE — Op Note (Signed)
12/01/2011  9:53 AM  PATIENT:  Stacy Meyer  51 y.o. female  PRE-OPERATIVE DIAGNOSIS:  menorrhagia;dysmenorrhea;fibroid uterus  POST-OPERATIVE DIAGNOSIS:  menorrhagia,dysmenorrhea,fibroid uterus  PROCEDURE:  Procedure(s): HYSTERECTOMY VAGINAL  SURGEON:  Surgeon(s): Ok Edwards, MD Dara Lords, MD  ANESTHESIA:   general  FINDINGS:Leiomyomatous uteri, normal tubes and ovaries. Peritoneal inclusion cysts  DESCRIPTION OF OPERATION: After administration of general anesthesia, the patient was placed in the dorsolithotomy position and examination under general anesthesia performed with findings as noted above. The patient was prepped and draped in the usual sterile fashion. The bladder was drained with a straight catheter, and a weighted speculum was placed into the vagina to visualize the cervix. The cervix was grasped with a double-tooth tenaculum. The cervix was circumferentially infiltrated in the submucosal layer with10cc of 1% Lidocaine with 1:100,000 Epinepghrine.  A circumferential incision was made at the cervicovaginal junction. The incision was carried down sharply anteriorly to the endopelvic fascia. The bladder was dissected away from the anterior surface of the uterus using blunt dissection. The vesicouterine peritoneal reflection was identified and sharply excised. The anterior cul de sac was explored. The posterior aspect of the mucosal incision was then dissected sharply. The peritoneum was identified and incised, and the posterior cul de sac explored.A long weighted billed speculum was placed in culdesac. The uterosacral ligaments were clamped, cut and ligated with #0-Vicryl suture bilaterally. #0-Vicryl was used throughout the case unless otherwise stated. The uterosacral ligaments were affixed to the corners of the vagina. The remainder of the cardinal and broad ligaments were then serially clamped, cut and ligated bilaterally.Morcellation of the uterus was under  taken to allow exposure for the pedicles to be clamped and cut. Both pedicles were then clamped and cut followed by a free tie then a transfixation stitch of 0-Vicryl.  The ovaries and fallopian tubes were identified and appeared normal. The pedicles were examined and were hemostatic. The posterior peritoneum was secured to the vagina with a running stitch of 0-Vicryl from 3-9 o'clock. The vaginal cuff was then closed with interrupted figure of eights with 0-Vicryl.A vaginal pack with Estrace vaginal cream was left in the vagina to be removed later in the day on the floor. Sponge and needle counts were correct. There were no complications. The patient was transferred to the recovery room in satisfactory condition.   ESTIMATED BLOOD LOSS:400 cc's  Intake/Output Summary (Last 24 hours) at 12/01/11 0953 Last data filed at 12/01/11 0855  Gross per 24 hour  Intake   1700 ml  Output    900 ml  Net    800 ml     BLOOD ADMINISTERED:none   LOCAL MEDICATIONS USED:  LIDOCAINE paracervical block  w/ 1:100,000 epinephrine (10 cc's)  SPECIMEN:  Source of Specimen:  Uterus and cervix and peritoneal inclusion cyst  DISPOSITION OF SPECIMEN:  PATHOLOGY  COUNTS:  YES  PLAN OF CARE: Transfer to PACU  [facility] HMD9:53 AMTD@

## 2011-12-01 NOTE — Transfer of Care (Signed)
Immediate Anesthesia Transfer of Care Note  Patient: Stacy Meyer  Procedure(s) Performed: Procedure(s) (LRB): HYSTERECTOMY VAGINAL (Right)  Patient Location: PACU  Anesthesia Type: General  Level of Consciousness: awake, alert  and oriented  Airway & Oxygen Therapy: Patient Spontanous Breathing and Patient connected to nasal cannula oxygen  Post-op Assessment: Report given to PACU RN and Post -op Vital signs reviewed and stable  Post vital signs: Reviewed and stable  Complications: No apparent anesthesia complications

## 2011-12-02 ENCOUNTER — Encounter (HOSPITAL_COMMUNITY): Payer: Self-pay | Admitting: Gynecology

## 2011-12-02 LAB — CBC
HCT: 35.5 % — ABNORMAL LOW (ref 36.0–46.0)
MCHC: 31.5 g/dL (ref 30.0–36.0)
MCV: 86.2 fL (ref 78.0–100.0)
Platelets: 310 10*3/uL (ref 150–400)
RDW: 14.2 % (ref 11.5–15.5)
WBC: 12.5 10*3/uL — ABNORMAL HIGH (ref 4.0–10.5)

## 2011-12-02 MED ORDER — ALBUTEROL SULFATE HFA 108 (90 BASE) MCG/ACT IN AERS: 2.0000 | INHALATION_SPRAY | Freq: Four times a day (QID) | RESPIRATORY_TRACT | Status: DC | PRN

## 2011-12-02 MED ORDER — OXYCODONE-ACETAMINOPHEN 5-325 MG PO TABS: 1.0000 | ORAL_TABLET | Freq: Four times a day (QID) | ORAL | Status: AC | PRN

## 2011-12-02 MED ORDER — DOCUSATE SODIUM 100 MG PO CAPS: 100.0000 mg | ORAL_CAPSULE | Freq: Two times a day (BID) | ORAL | Status: AC

## 2011-12-02 NOTE — Progress Notes (Signed)
Pt. Is discharged in the care ofSon. Downstairs per ambulatory. Stable. Denies any pain or discomfort. Stable she has little to no Vaginal bleeding.Denies pain or discomfort,or temp.

## 2011-12-02 NOTE — Discharge Summary (Signed)
Physician Discharge Summary  Patient ID: Stacy Meyer MRN: [id number] DOB/AGE: March 18, 1961 51 y.o.  Admit date: 12/01/2011 Discharge date: 12/02/2011  Admission Diagnoses: Leiomyomatous uteri, dysmenorrhea, menorrhagia, anemia  Discharge Diagnoses: Same Active Problems:  * No active hospital problems. *    Discharged Condition: good  Hospital Course: Patient was admitted to the hospital on April 29 where she underwent a transvaginal hysterectomy with ovarian conservation as a result of her leiomyomatous uteri. Postoperatively patient did well had adequate urinary output and her Foley catheter was discontinued in less than 24 hours and she was voiding spontaneously. She used  the incentive spirometer, ambulated, tolerated liquids and regular diet. Her vital signs were stable and she was afebrile and her discharge hemoglobin was 11.2.  Consults: None  Significant Diagnostic Studies: Final pathology report:  Diagnosis Uterus and cervix, with inflammatory cysts and fibroids (morcellated) - LEIOMYOMA. - ADENOMYOSIS. - ENDOMETRIUM: BENIGN WEAKLY PROLIFERATIVE ENDOMETRIUM, NO ATYPIA, HYPERPLASIA OR MALIGNANCY. - CERVIX: BENIGN SQUAMOUS MUCOSA AND ENDOCERVICAL MUCOSA, NO DYSPLASIA OR MALIGNANCY. - UTERINE SEROSA: UNREMARKABLE  Treatments: surgery: TVH  Discharge Exam: Blood pressure 111/73, pulse 69, temperature 97.9 F (36.6 C), temperature source Oral, resp. rate 18, height 5\' 4"  (1.626 m), weight 327 lb (148.326 kg), SpO2 99.00%. General appearance: alert and cooperative Resp: clear to auscultation bilaterally Chest wall: no tenderness GI: soft, non-tender; bowel sounds normal; no masses,  no organomegaly Extremities: extremities normal, atraumatic, no cyanosis or edema  Disposition:   Discharge Orders    Future Appointments: Provider: Department: Dept Phone: Center:   12/16/2011 9:00 AM Ok Edwards, MD [facility] [phone] [facility]     Future Orders Please  Complete By Expires   Resume previous diet      Driving Restrictions      Comments:   No driving 1  weeks   Lifting restrictions      Comments:   No lifting for 6 weeks   Call MD for:  temperature >100.5      Call MD for:  redness, tenderness, or signs of infection (pain, swelling, bleeding, redness, odor or green/yellow discharge around incision site)      Call MD for:  severe or increased pain, loss or decreased feeling  in affected limb(s)      Increase activity slowly      Comments:   Walk with assistance use walker or cane as needed   May shower         Medication List  As of 12/02/2011  2:17 PM   STOP taking these medications         megestrol 40 MG tablet         TAKE these medications         albuterol 108 (90 BASE) MCG/ACT inhaler   Commonly known as: PROVENTIL HFA;VENTOLIN HFA   Inhale 2 puffs into the lungs every 6 (six) hours as needed. For wheezing.      docusate sodium 100 MG capsule   Commonly known as: COLACE   Take 1 capsule (100 mg total) by mouth 2 (two) times daily.      Fluticasone-Salmeterol 100-50 MCG/DOSE Aepb   Commonly known as: ADVAIR   Inhale 1 puff into the lungs every 12 (twelve) hours.      oxyCODONE-acetaminophen 5-325 MG per tablet   Commonly known as: PERCOCET   Take 1-2 tablets by mouth every 6 (six) hours as needed.           Follow-up Information  Follow up with Ok Edwards, MD in 2 weeks. (Tuesday  May 14  9:00am appointment)    Contact information:   [address] [place] [address] [phone]          Signed: Ok Edwards 12/02/2011, 2:17 PM

## 2011-12-02 NOTE — Progress Notes (Signed)
Patient ID: Stacy Meyer, female   DOB: April 07, 1961, 51 y.o.   MRN: [id number]   Subjective: Patient reports tolerating PO and no problems voiding.    Objective: I have reviewed patient's vital signs.  vital signs, intake and output, medications and labs.  Filed Vitals:   12/02/11 0548  BP:  121/73  Pulse: 78    Temp: 98.5  Resp: 13   I/O last 3 completed shifts: In: 2350 [P.O.:400; I.V.:1950] Out: 2950 [Urine:2550; Blood:400] Total I/O In: -  Out: 300 [Urine:300]  Results for orders placed during the hospital encounter of 12/01/11 (from the past 24 hour(s))  CBC     Status: Abnormal   Collection Time   12/02/11  5:37 AM      Component Value Range   WBC 12.5 (*) 4.0 - 10.5 (K/uL)   RBC 4.12  3.87 - 5.11 (MIL/uL)   Hemoglobin 11.2 (*) 12.0 - 15.0 (g/dL)   HCT 40.9 (*) 81.1 - 46.0 (%)   MCV 86.2  78.0 - 100.0 (fL)   MCH 27.2  26.0 - 34.0 (pg)   MCHC 31.5  30.0 - 36.0 (g/dL)   RDW 91.4  78.2 - 95.6 (%)   Platelets 310  150 - 400 (K/uL)    EXAM General: alert and cooperative Resp: clear to auscultation bilaterally Cardio: regular rate and rhythm, S1, S2 normal, no murmur, click, rub or gallop GI: soft, non-tender; bowel sounds normal; no masses,  no organomegaly Extremities: extremities normal, atraumatic, no cyanosis or edema Vaginal Bleeding: minimal  Assessment: s/p Procedure(s): HYSTERECTOMY VAGINAL: stable, progressing well and tolerating diet. Post op day # 1. Voiding spontaneously  Plan: Advance diet Encourage ambulation Discontinue IV fluids Plan d/c home this afternoon  LOS: 1 day    Ok Edwards, MD 12/02/2011 9:41 AM    12/02/2011, 9:41 AM

## 2011-12-16 ENCOUNTER — Ambulatory Visit (INDEPENDENT_AMBULATORY_CARE_PROVIDER_SITE_OTHER): Payer: PRIVATE HEALTH INSURANCE | Admitting: Gynecology

## 2011-12-16 ENCOUNTER — Encounter: Payer: Self-pay | Admitting: Gynecology

## 2011-12-16 VITALS — BP 124/80

## 2011-12-16 DIAGNOSIS — Z9889 Other specified postprocedural states: Secondary | ICD-10-CM

## 2011-12-16 NOTE — Progress Notes (Signed)
Patient is a 51 year old who presented to the office today for a two-week postop appointment. Patient status post transvaginal hysterectomy secondary to symptomatic leiomyomatous uteri (menorrhagia, dysmenorrhea and anemia). Patient is doing well and would like to return to work this week.  Final pathology report as follows: Diagnosis Uterus and cervix, with inflammatory cysts and fibroids (morcellated) - LEIOMYOMA. - ADENOMYOSIS. - ENDOMETRIUM: BENIGN WEAKLY PROLIFERATIVE ENDOMETRIUM, NO ATYPIA, HYPERPLASIA OR MALIGNANCY. - CERVIX: BENIGN SQUAMOUS MUCOSA AND ENDOCERVICAL MUCOSA, NO DYSPLASIA OR MALIGNANCY. - UTERINE SEROSA: UNREMARKABLE.  Exam: Abdomen: Soft nontender no rebound or guarding Bartholin urethra Skene glands: Within normal limits Vagina: No lesions or discharge vaginal cuff intact suture still present. Silver nitrate applied to an area that was slightly oozing from the suture site otherwise healing well. Bimanual: No palpable masses or tenderness Rectal: Not examined  Assessment/plan: Patient 2 weeks postop status post transvaginal hysterectomy with ovarian conservation recovering well. 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 She will return back to the office in 4 weeks for final postop visit.

## 2012-01-13 ENCOUNTER — Encounter: Payer: Self-pay | Admitting: Gynecology

## 2012-01-13 ENCOUNTER — Ambulatory Visit (INDEPENDENT_AMBULATORY_CARE_PROVIDER_SITE_OTHER): Payer: PRIVATE HEALTH INSURANCE | Admitting: Gynecology

## 2012-01-13 VITALS — BP 134/80

## 2012-01-13 DIAGNOSIS — Z9889 Other specified postprocedural states: Secondary | ICD-10-CM

## 2012-01-13 NOTE — Progress Notes (Signed)
Patient is a 51 year old who presented to the office today for her six-week postop appointment. Patient status post transvaginal hysterectomy secondary to symptomatic leiomyomatous uteri (menorrhagia, dysmenorrhea and anemia). Patient is doing well and would like to return to work this week.  Final pathology report as follows:   Diagnosis  Uterus and cervix, with inflammatory cysts and fibroids (morcellated)  - LEIOMYOMA.  - ADENOMYOSIS.  - ENDOMETRIUM: BENIGN WEAKLY PROLIFERATIVE ENDOMETRIUM, NO ATYPIA, HYPERPLASIA  OR MALIGNANCY.  - CERVIX: BENIGN SQUAMOUS MUCOSA AND ENDOCERVICAL MUCOSA, NO DYSPLASIA OR  MALIGNANCY.  - UTERINE SEROSA: UNREMARKABLE.   Exam:  Abdomen: Soft nontender no rebound or guarding  Bartholin urethra Skene glands: Within normal limits  Vagina: Cuff intact no lesions well healed Bimanual: No palpable masses or tenderness  Rectal: Not examined  Assessment/plan: Patient 6 weeks status post transvaginal hysterectomy with ovarian conservation doing well. Patient has return to full activity at work and we will see her back in one year or when necessary.

## 2012-04-08 ENCOUNTER — Ambulatory Visit (INDEPENDENT_AMBULATORY_CARE_PROVIDER_SITE_OTHER): Payer: PRIVATE HEALTH INSURANCE | Admitting: Family Medicine

## 2012-04-08 ENCOUNTER — Encounter: Payer: Self-pay | Admitting: Family Medicine

## 2012-04-08 VITALS — BP 110/74 | HR 78 | Temp 98.4°F | Wt 312.8 lb

## 2012-04-08 DIAGNOSIS — R42 Dizziness and giddiness: Secondary | ICD-10-CM

## 2012-04-08 DIAGNOSIS — G4700 Insomnia, unspecified: Secondary | ICD-10-CM

## 2012-04-08 NOTE — Progress Notes (Signed)
  Subjective:    Patient ID: Stacy Meyer, female    DOB: Sep 26, 1960, 51 y.o.   MRN: [id number]  HPI CC: not feeling well.  Pleasant 51 yo with h/o obesity, asthma presents with several month history of trouble sleeping and dizziness described as "swimmy headed" and lightheaded.  Dizziness starts with fatigue.  No vertigo, presyncope, LOC.  No palpitations, CP/tightness, SOB, HA.  Trouble sleeping - trouble falling asleep and staying asleep.  Does not have bedtime routine.  Calm, quiet, dark environment.  Has TV in room.  Sometimes tries to read but then awakens shortly.    Asthma - not taking advair, uses albuterol seldom.  Surgery 11/2011 - vag hysterectomy.  Both ovaries remain.  Denies EtOH.  Pt would like to avoid pharmacotherapy for insomnia.  Wt Readings from Last 3 Encounters:  04/08/12 312 lb 12 oz (141.862 kg)  12/01/11 327 lb (148.326 kg)  12/01/11 327 lb (148.326 kg)  down 15 lbs.  No appetite.  Lab Results  Component Value Date   WBC 12.5* 12/02/2011   HGB 11.2* 12/02/2011   HCT 35.5* 12/02/2011   MCV 86.2 12/02/2011   PLT 310 12/02/2011   Review of Systems Per HPI    Objective:   Physical Exam  Nursing note and vitals reviewed. Constitutional: She appears well-developed and well-nourished. No distress.  HENT:  Head: Normocephalic and atraumatic.  Mouth/Throat: Uvula is midline, oropharynx is clear and moist and mucous membranes are normal. No oropharyngeal exudate, posterior oropharyngeal edema, posterior oropharyngeal erythema or tonsillar abscesses.  Eyes: Conjunctivae and EOM are normal. Pupils are equal, round, and reactive to light. No scleral icterus.  Neck: Normal range of motion. Neck supple. Carotid bruit is not present.  Cardiovascular: Normal rate, regular rhythm, normal heart sounds and intact distal pulses.   No murmur heard. Pulmonary/Chest: Effort normal and breath sounds normal. No respiratory distress. She has no wheezes. She has no rales.    Musculoskeletal: She exhibits no edema.  Lymphadenopathy:    She has no cervical adenopathy.  Neurological: She has normal strength. No cranial nerve deficit or sensory deficit. She exhibits normal muscle tone. She displays a negative Romberg sign. Coordination normal.       CN 2-12 intact. Station and gait intact. Normal FTN. Neg pronator drift.  Skin: Skin is warm and dry. No rash noted.  Psychiatric: She has a normal mood and affect.       Assessment & Plan:  f

## 2012-04-08 NOTE — Assessment & Plan Note (Signed)
Discussed sleep hygiene as well as importance of regular bedtime routine Provided with handout on sleep hygiene measures. Pt would like to defer pharmacotherapy for now but did discuss use of melatonin and benadryl.

## 2012-04-08 NOTE — Patient Instructions (Addendum)
Blood pressure looking good today. For sleep - try steps we discussed today for sleep hygiene.  Very important to have daily sleep routine.  Try to go to bed at same time every night. May also try benadryl or melatonin over the counter. Blood work today to check on dizziness. Good to see you today, call us with quesitons.  Please let us know if not improving.  Insomnia Insomnia is frequent trouble falling and/or staying asleep. Insomnia can be a long term problem or a short term problem. Both are common. Insomnia can be a short term problem when the wakefulness is related to a certain stress or worry. Long term insomnia is often related to ongoing stress during waking hours and/or poor sleeping habits. Overtime, sleep deprivation itself can make the problem worse. Every little thing feels more severe because you are overtired and your ability to cope is decreased. CAUSES   Stress, anxiety, and depression.   Poor sleeping habits.   Distractions such as TV in the bedroom.   Naps close to bedtime.   Engaging in emotionally charged conversations before bed.   Technical reading before sleep.   Alcohol and other sedatives. They may make the problem worse. They can hurt normal sleep patterns and normal dream activity.   Stimulants such as caffeine for several hours prior to bedtime.   Pain syndromes and shortness of breath can cause insomnia.   Exercise late at night.   Changing time zones may cause sleeping problems (jet lag).  It is sometimes helpful to have someone observe your sleeping patterns. They should look for periods of not breathing during the night (sleep apnea). They should also look to see how long those periods last. If you live alone or observers are uncertain, you can also be observed at a sleep clinic where your sleep patterns will be professionally monitored. Sleep apnea requires a checkup and treatment. Give your caregivers your medical history. Give your caregivers  observations your family has made about your sleep.  SYMPTOMS   Not feeling rested in the morning.   Anxiety and restlessness at bedtime.   Difficulty falling and staying asleep.  TREATMENT   Your caregiver may prescribe treatment for an underlying medical disorders. Your caregiver can give advice or help if you are using alcohol or other drugs for self-medication. Treatment of underlying problems will usually eliminate insomnia problems.   Medications can be prescribed for short time use. They are generally not recommended for lengthy use.   Over-the-counter sleep medicines are not recommended for lengthy use. They can be habit forming.   You can promote easier sleeping by making lifestyle changes such as:   Using relaxation techniques that help with breathing and reduce muscle tension.   Exercising earlier in the day.   Changing your diet and the time of your last meal. No night time snacks.   Establish a regular time to go to bed.   Counseling can help with stressful problems and worry.   Soothing music and white noise may be helpful if there are background noises you cannot remove.   Stop tedious detailed work at least one hour before bedtime.  HOME CARE INSTRUCTIONS   Keep a diary. Inform your caregiver about your progress. This includes any medication side effects. See your caregiver regularly. Take note of:   Times when you are asleep.   Times when you are awake during the night.   The quality of your sleep.   How you feel the next day.  This information will help your caregiver care for you.  Get out of bed if you are still awake after 15 minutes. Read or do some quiet activity. Keep the lights down. Wait until you feel sleepy and go back to bed.   Keep regular sleeping and waking hours. Avoid naps.   Exercise regularly.   Avoid distractions at bedtime. Distractions include watching television or engaging in any intense or detailed activity like attempting to  balance the household checkbook.   Develop a bedtime ritual. Keep a familiar routine of bathing, brushing your teeth, climbing into bed at the same time each night, listening to soothing music. Routines increase the success of falling to sleep faster.   Use relaxation techniques. This can be using breathing and muscle tension release routines. It can also include visualizing peaceful scenes. You can also help control troubling or intruding thoughts by keeping your mind occupied with boring or repetitive thoughts like the old concept of counting sheep. You can make it more creative like imagining planting one beautiful flower after another in your backyard garden.   During your day, work to eliminate stress. When this is not possible use some of the previous suggestions to help reduce the anxiety that accompanies stressful situations.  MAKE SURE YOU:   Understand these instructions.   Will watch your condition.   Will get help right away if you are not doing well or get worse.  Document Released: 07/18/2000 Document Revised: 07/10/2011 Document Reviewed: 08/18/2007 [facility] Patient Information 2012 [place], [place].

## 2012-04-08 NOTE — Assessment & Plan Note (Signed)
Described as lightheadedness. Orthostatics negative today. Doubt cardiac cause.  nonfocal neurologic exam. Will check for reversible causes of dizziness today.  O/w continue to monitor. Recently did have viral illness (3 weeks ago) but doubt related.

## 2012-04-09 LAB — COMPREHENSIVE METABOLIC PANEL
ALT: 8 U/L (ref 0–35)
AST: 17 U/L (ref 0–37)
Alkaline Phosphatase: 50 U/L (ref 39–117)
BUN: 16 mg/dL (ref 6–23)
Calcium: 9 mg/dL (ref 8.4–10.5)
Chloride: 106 mEq/L (ref 96–112)
Creatinine, Ser: 0.8 mg/dL (ref 0.4–1.2)
Total Bilirubin: 0.2 mg/dL — ABNORMAL LOW (ref 0.3–1.2)

## 2012-04-09 LAB — CBC WITH DIFFERENTIAL/PLATELET
Basophils Relative: 1.2 % (ref 0.0–3.0)
Eosinophils Absolute: 0.5 10*3/uL (ref 0.0–0.7)
Eosinophils Relative: 7.4 % — ABNORMAL HIGH (ref 0.0–5.0)
HCT: 37.4 % (ref 36.0–46.0)
Hemoglobin: 11.9 g/dL — ABNORMAL LOW (ref 12.0–15.0)
Lymphs Abs: 2.5 10*3/uL (ref 0.7–4.0)
MCHC: 31.9 g/dL (ref 30.0–36.0)
MCV: 85.2 fl (ref 78.0–100.0)
Monocytes Absolute: 0.6 10*3/uL (ref 0.1–1.0)
Neutro Abs: 3 10*3/uL (ref 1.4–7.7)
Neutrophils Relative %: 45.6 % (ref 43.0–77.0)
RBC: 4.39 Mil/uL (ref 3.87–5.11)
WBC: 6.6 10*3/uL (ref 4.5–10.5)

## 2013-01-06 ENCOUNTER — Encounter: Payer: Self-pay | Admitting: Family Medicine

## 2013-01-06 ENCOUNTER — Ambulatory Visit (INDEPENDENT_AMBULATORY_CARE_PROVIDER_SITE_OTHER): Payer: Managed Care, Other (non HMO) | Admitting: Family Medicine

## 2013-01-06 ENCOUNTER — Other Ambulatory Visit: Payer: Self-pay | Admitting: Family Medicine

## 2013-01-06 VITALS — BP 136/82 | HR 73 | Temp 98.6°F | Wt 318.2 lb

## 2013-01-06 DIAGNOSIS — M25569 Pain in unspecified knee: Secondary | ICD-10-CM

## 2013-01-06 DIAGNOSIS — R600 Localized edema: Secondary | ICD-10-CM | POA: Insufficient documentation

## 2013-01-06 DIAGNOSIS — M25562 Pain in left knee: Secondary | ICD-10-CM

## 2013-01-06 DIAGNOSIS — R609 Edema, unspecified: Secondary | ICD-10-CM

## 2013-01-06 DIAGNOSIS — M25561 Pain in right knee: Secondary | ICD-10-CM | POA: Insufficient documentation

## 2013-01-06 MED ORDER — ALBUTEROL SULFATE HFA 108 (90 BASE) MCG/ACT IN AERS: 2.0000 | INHALATION_SPRAY | Freq: Four times a day (QID) | RESPIRATORY_TRACT | Status: DC | PRN

## 2013-01-06 MED ORDER — HYDROCHLOROTHIAZIDE 12.5 MG PO TABS: 12.5000 mg | ORAL_TABLET | Freq: Every day | ORAL | Status: DC

## 2013-01-06 NOTE — Assessment & Plan Note (Signed)
1 wk h/o L lateral knee joint pain associated with some mechanical sxs. Discussed options of physical therapy - pt declines currently. Will avoid NSAID for now given recent concomitant pedal edema. Discussed tramadol - pt declines. If not improving, consider referral to ortho for further evaluation. Treat with ice/heat, tylenol scheduled, and elevation of leg.  Recommended she buy neoprene knee brace.

## 2013-01-06 NOTE — Patient Instructions (Addendum)
For knee - start tylenol 1000mg  twice daily.  Buy knee brace.  Ice/heat to knee.  Elevate knee. For leg swelling - elevate legs.  Drink plenty of water.  Avoid salt intake (sodium limit to 2000mg  daily).  May try water pill (HCTZ) during the week. Return in 2 weeks for lab visit to check blood. Keep me updated on how you are doing.

## 2013-01-06 NOTE — Assessment & Plan Note (Signed)
Bilateral, dependent. anticipate obesity related as well as possibly developing venous insufficiency. Treat with elevation of leg, increased water intake, avoiding salt, and may try HCTZ during the week. rtc 2 wks for K check and TSH.

## 2013-01-06 NOTE — Progress Notes (Signed)
  Subjective:    Patient ID: Stacy Meyer, female    DOB: 29-Mar-1961, 52 y.o.   MRN: [id number]  HPI CC: leg swelling and kneecap pain  L Kneecap - popping and locking in place.  Started bothering her 1 month ago.  Some instability with knee.  Denies inciting fall or trauma.  H/o R knee arthritis but never problems with left.  So far has not tried any meds for this.  Using icy hot.  Worse wit hgoing up and down stairs.    Leg swelling - ongoing for last several years.  Bilateral leg swelling.  Recently worsening.  Has had to miss work 2/2 leg swelling.  Drinking several water bottles a day.  Seems to be retaining fluids on weekdays - when she's on her feet all day.  Notes throbbing ache when legs swell.  Noticing ankle swelling more recently.  Propping legs on coffee table didn't help. No dyspnea, chest pain, or cough.  No PNdyspnea.  No orthopnea. Lab Results  Component Value Date   CREATININE 0.8 04/08/2012   Lab Results  Component Value Date   TSH 1.19 04/08/2012    Past Medical History  Diagnosis Date  . Anemia 02/2009    SEVERE  . Asthma   . Morbid obesity   . Blood transfusion 2011    [place]   Review of Systems Per HPI    Objective:   Physical Exam  Nursing note and vitals reviewed. Constitutional: She appears well-developed and well-nourished. No distress.  HENT:  Mouth/Throat: Oropharynx is clear and moist. No oropharyngeal exudate.  Eyes: Conjunctivae and EOM are normal. Pupils are equal, round, and reactive to light. No scleral icterus.  Cardiovascular: Normal rate, regular rhythm, normal heart sounds and intact distal pulses.   No murmur heard. Pulmonary/Chest: Effort normal and breath sounds normal. No respiratory distress. She has no wheezes. She has no rales.  Musculoskeletal: She exhibits edema (1+ pedal edema).  Bilateral knee exam: No deformity or asymmetry noted. Tender to palpation of left knee at lateral joint line and lateral to patella. No  significant crepitus noted. Neg drawer bilaterally Neg mcmurray bilat No PFgrind bilaterally No abnormal patellar mobility. No pain/laxity with valgus/varus testing bilaterally No popliteal fullness  2+ DP bilaterally No palp cords  Skin: Skin is warm and dry. No rash noted.       Assessment & Plan:

## 2013-01-20 ENCOUNTER — Other Ambulatory Visit (INDEPENDENT_AMBULATORY_CARE_PROVIDER_SITE_OTHER): Payer: Managed Care, Other (non HMO)

## 2013-01-20 DIAGNOSIS — R600 Localized edema: Secondary | ICD-10-CM

## 2013-01-20 DIAGNOSIS — R609 Edema, unspecified: Secondary | ICD-10-CM

## 2013-01-21 LAB — BASIC METABOLIC PANEL
CO2: 32 mEq/L (ref 19–32)
Glucose, Bld: 92 mg/dL (ref 70–99)
Potassium: 3.2 mEq/L — ABNORMAL LOW (ref 3.5–5.1)
Sodium: 137 mEq/L (ref 135–145)

## 2013-01-22 ENCOUNTER — Other Ambulatory Visit: Payer: Self-pay | Admitting: Family Medicine

## 2013-01-22 MED ORDER — POTASSIUM CHLORIDE CRYS ER 20 MEQ PO TBCR: 20.0000 meq | EXTENDED_RELEASE_TABLET | Freq: Every day | ORAL | Status: DC

## 2013-11-10 ENCOUNTER — Emergency Department: Payer: Self-pay | Admitting: Emergency Medicine

## 2013-11-10 LAB — CBC WITH DIFFERENTIAL/PLATELET
BASOS PCT: 1.1 %
Basophil #: 0.1 10*3/uL (ref 0.0–0.1)
EOS ABS: 0.3 10*3/uL (ref 0.0–0.7)
Eosinophil %: 6.6 %
HCT: 36.2 % (ref 35.0–47.0)
HGB: 11.6 g/dL — AB (ref 12.0–16.0)
Lymphocyte #: 1.4 10*3/uL (ref 1.0–3.6)
Lymphocyte %: 27.7 %
MCH: 26.7 pg (ref 26.0–34.0)
MCHC: 32.1 g/dL (ref 32.0–36.0)
MCV: 83 fL (ref 80–100)
MONOS PCT: 15.5 %
Monocyte #: 0.8 x10 3/mm (ref 0.2–0.9)
NEUTROS PCT: 49.1 %
Neutrophil #: 2.5 10*3/uL (ref 1.4–6.5)
PLATELETS: 356 10*3/uL (ref 150–440)
RBC: 4.36 10*6/uL (ref 3.80–5.20)
RDW: 14.5 % (ref 11.5–14.5)
WBC: 5.2 10*3/uL (ref 3.6–11.0)

## 2013-11-10 LAB — COMPREHENSIVE METABOLIC PANEL
ALK PHOS: 96 U/L
ANION GAP: 2 — AB (ref 7–16)
AST: 41 U/L — AB (ref 15–37)
Albumin: 2.8 g/dL — ABNORMAL LOW (ref 3.4–5.0)
BUN: 11 mg/dL (ref 7–18)
Bilirubin,Total: 0.2 mg/dL (ref 0.2–1.0)
CALCIUM: 8.1 mg/dL — AB (ref 8.5–10.1)
CHLORIDE: 107 mmol/L (ref 98–107)
CREATININE: 0.86 mg/dL (ref 0.60–1.30)
Co2: 30 mmol/L (ref 21–32)
EGFR (African American): >60
EGFR (Non-African Amer.): >60
Glucose: 105 mg/dL — ABNORMAL HIGH (ref 65–99)
Osmolality: 277 (ref 275–301)
Potassium: 3.7 mmol/L (ref 3.5–5.1)
SGPT (ALT): 28 U/L (ref 12–78)
SODIUM: 139 mmol/L (ref 136–145)
TOTAL PROTEIN: 6.9 g/dL (ref 6.4–8.2)

## 2013-11-10 LAB — SEDIMENTATION RATE: ERYTHROCYTE SED RATE: 24 mm/h (ref 0–30)

## 2014-05-01 ENCOUNTER — Other Ambulatory Visit: Payer: Self-pay | Admitting: Family Medicine

## 2014-06-05 ENCOUNTER — Encounter: Payer: Self-pay | Admitting: Family Medicine

## 2015-10-09 ENCOUNTER — Other Ambulatory Visit: Payer: Self-pay

## 2015-10-09 DIAGNOSIS — Z1231 Encounter for screening mammogram for malignant neoplasm of breast: Secondary | ICD-10-CM

## 2015-10-29 ENCOUNTER — Ambulatory Visit
Admission: RE | Admit: 2015-10-29 | Discharge: 2015-10-29 | Disposition: A | Discharge status: Home / Self Care | Payer: Managed Care, Other (non HMO) | Source: Ambulatory Visit

## 2015-10-29 DIAGNOSIS — Z1231 Encounter for screening mammogram for malignant neoplasm of breast: Secondary | ICD-10-CM

## 2016-09-23 ENCOUNTER — Ambulatory Visit (INDEPENDENT_AMBULATORY_CARE_PROVIDER_SITE_OTHER): Payer: Managed Care, Other (non HMO) | Admitting: Primary Care

## 2016-09-23 ENCOUNTER — Encounter: Payer: Self-pay | Admitting: Primary Care

## 2016-09-23 VITALS — BP 146/82 | HR 93 | Temp 97.9°F | Ht 64.0 in | Wt 344.8 lb

## 2016-09-23 DIAGNOSIS — Z1211 Encounter for screening for malignant neoplasm of colon: Secondary | ICD-10-CM

## 2016-09-23 DIAGNOSIS — M25561 Pain in right knee: Secondary | ICD-10-CM | POA: Diagnosis not present

## 2016-09-23 DIAGNOSIS — G8929 Other chronic pain: Secondary | ICD-10-CM

## 2016-09-23 DIAGNOSIS — Z6841 Body Mass Index (BMI) 40.0 and over, adult: Secondary | ICD-10-CM

## 2016-09-23 DIAGNOSIS — J4520 Mild intermittent asthma, uncomplicated: Secondary | ICD-10-CM | POA: Diagnosis not present

## 2016-09-23 DIAGNOSIS — R030 Elevated blood-pressure reading, without diagnosis of hypertension: Secondary | ICD-10-CM

## 2016-09-23 NOTE — Assessment & Plan Note (Signed)
Intermittent use of both inhalers, discussed proper use of LABA/ICS and albuterol.  She verbalized understanding. Asthma seems mild and intermittent, uncomplicated. Work on weight loss.

## 2016-09-23 NOTE — Progress Notes (Signed)
Pre visit review using our clinic review tool, if applicable. No additional management support is needed unless otherwise documented below in the visit note. 

## 2016-09-23 NOTE — Assessment & Plan Note (Signed)
Long discussion today regarding diet and exercise. Will have her continue current diet, slowly incorporate exercise. Will have her start PT for her right knee as this has kept her from exercise. Continue to monitor.

## 2016-09-23 NOTE — Assessment & Plan Note (Signed)
Above goal today, does not check BP. Will continue to closely monitor. Based off of chart review, was once on HCTZ for pedal edema.

## 2016-09-23 NOTE — Assessment & Plan Note (Signed)
Xray in October 2017 unremarkable. Symptoms very similar to arthritis. No weakness. Will have her start PT for mobility and strengthening. Obtain brace from medical supply store. Encouraged weight loss.

## 2016-09-23 NOTE — Patient Instructions (Addendum)
Complete lab work prior to leaving today. I will notify you of your results once received.   You will be contacted regarding your referral to physical therapy and GI.  Please let us know if you have not heard back within one week.   Continue to work on [employer].   Start exercising. You should be getting 150 minutes of moderate intensity exercise weekly. Work with physical therapy.  Monitor your blood pressure and notify me of readings at or above 140/90 on a consistent basis.   It was a pleasure to meet you today! Please don't hesitate to call me with any questions. Welcome to [facility]!

## 2016-09-23 NOTE — Progress Notes (Signed)
Subjective:    Patient ID: Stacy Meyer, female    DOB: May 21, 1961, 56 y.o.   MRN: [id number]  HPI  Stacy Meyer is a 56 year old female who presents today to establish care and discuss the problems mentioned below. Will obtain old records.  1) Asthma: Diagnosed as a child. Currently managed on Advair 100-50 mcg, albuterol HFA. She doesn't use either inhaler daily, and will use both as needed. On average she uses her inhalers once every 1-2 months, sometimes less frequently. She does understand that Advair is typically used on a daily basis.  2) Knee Pain: Located to right knee that has been present since her knee gave out one afternoon in Fall 2017. She presented to urgent care in October 2017 who completed xrays which were unremarkable.   Since her visit to urgent care she's noticed popping and a pulling sensation to the anterior knee. Sleeping is tough as she will have to prop her up knee to prevent pain and stiffness. She sits for work during the day. Her pain is worse when she gets up from sitting for prolonged periods of time. She has noticed swelling intermittently. She doesn't take anything on a daily basis for pain, occasional tylenol or ibuprofen. She has good days and bad days with her pain. She denies numbness/tingling, recent injury, calf swelling. She has purchased several wraps for her knee that won't stay up. She's had a hard time finding knee braces that will fit.  3) Elevated Blood Pressure Reading: BP noted to be 146/82 today. She's not recently had her BP checked with the last check being 1 year ago. She was managed on a "fluid" pill in the past, denies prior diagnosis of hypertension.   4) Obesity: Long history of. She once lost 30 pounds in 2017 through a high protein diet and regained all of her weight towards the end of the year. She recently started a vegetarian diet 1 month ago and has lost nearly 8 pounds.   Diet currently consists of:  Breakfast: Fruit, yogurt,  boiled egg Lunch: Mac and Cheese, Noodles, veggies, tofu Dinner: Yogurt, fruit, mixed vegetables Snacks: Nuts Desserts: Very occasional, 1 day weekly Beverages: Water (over 60 ounces daily), ginger ale (20 ounces daily)  Exercise: She does not exercise.      Review of Systems  Eyes: Negative for visual disturbance.  Respiratory: Negative for cough, shortness of breath and wheezing.   Cardiovascular: Negative for chest pain and leg swelling.  Musculoskeletal: Positive for arthralgias and joint swelling.  Neurological: Negative for numbness and headaches.       Past Medical History:  Diagnosis Date  . Anemia 02/2009  . Asthma   . Blood transfusion 2011   [place]  . Morbid obesity ([place])      Social History   Social History  . Marital status: Single    Spouse name: N/A  . Number of children: N/A  . Years of education: N/A   Occupational History  . Not on file.   Social History Main Topics  . Smoking status: Never Smoker  . Smokeless tobacco: Never Used  . Alcohol use Yes     Comment: rarely  . Drug use: No  . Sexual activity: No   Other Topics Concern  . Not on file   Social History Narrative   Single.   2 children, 1 grandchildren.   Works in [employer], Network engineer job.   Enjoys relaxing.    Past Surgical History:  Procedure Laterality Date  . CARPAL TUNNEL RELEASE    . EXCISION OF GANGLION CYST    . TUBAL LIGATION  1988  . VAGINAL HYSTERECTOMY  12/01/2011   Procedure: HYSTERECTOMY VAGINAL;  Surgeon: Terrance Mass, MD;  Location: [facility];  Service: Gynecology;  Laterality: Right;    Family History  Problem Relation Age of Onset  . Breast cancer Mother   . Diabetes Father   . Hypertension Father   . Heart disease Father   . Ovarian cancer Sister   . Cancer Brother     Oral  . Colon cancer Brother     No Known Allergies  Current Outpatient Prescriptions on File Prior to Visit  Medication Sig Dispense Refill  . ADVAIR DISKUS 100-50 MCG/DOSE  AEPB INHALE ONE DOSE BY MOUTH EVERY 12 HOURS 60 each 3  . albuterol (PROAIR HFA) 108 (90 BASE) MCG/ACT inhaler Inhale 2 puffs into the lungs every 6 (six) hours as needed. For wheezing. 1 Inhaler 6   No current facility-administered medications on file prior to visit.     BP (!) 146/82   Pulse 93   Temp 97.9 F (36.6 C) (Oral)   Ht 5' 4"  (1.626 m)   Wt (!) 344 lb 12.8 oz (156.4 kg)   LMP 09/20/2011   SpO2 98%   BMI 59.18 kg/m    Objective:   Physical Exam  Constitutional: She appears well-nourished.  Neck: Neck supple.  Cardiovascular: Normal rate and regular rhythm.   Pulmonary/Chest: Effort normal and breath sounds normal. She has no wheezes.  Musculoskeletal:       Right knee: She exhibits decreased range of motion. She exhibits no swelling and no bony tenderness. Tenderness found. Medial joint line and MCL tenderness noted.  Skin: Skin is warm and dry.  Psychiatric: She has a normal mood and affect.          Assessment & Plan:

## 2016-09-24 LAB — COMPREHENSIVE METABOLIC PANEL
ALBUMIN: 3.7 g/dL (ref 3.5–5.2)
ALT: 9 U/L (ref 0–35)
AST: 15 U/L (ref 0–37)
Alkaline Phosphatase: 58 U/L (ref 39–117)
BUN: 12 mg/dL (ref 6–23)
CHLORIDE: 107 meq/L (ref 96–112)
CO2: 31 mEq/L (ref 19–32)
Calcium: 9.2 mg/dL (ref 8.4–10.5)
Creatinine, Ser: 1.07 mg/dL (ref 0.40–1.20)
GFR: 68.88 mL/min (ref >60.00)
Glucose, Bld: 93 mg/dL (ref 70–99)
POTASSIUM: 4.2 meq/L (ref 3.5–5.1)
SODIUM: 141 meq/L (ref 135–145)
Total Bilirubin: 0.2 mg/dL (ref 0.2–1.2)
Total Protein: 6.8 g/dL (ref 6.0–8.3)

## 2016-09-24 LAB — LIPID PANEL
Cholesterol: 180 mg/dL (ref 0–200)
HDL: 57.9 mg/dL (ref >39.00)
LDL CALC: 103 mg/dL — AB (ref 0–99)
NONHDL: 122.16
Total CHOL/HDL Ratio: 3
Triglycerides: 95 mg/dL (ref 0.0–149.0)
VLDL: 19 mg/dL (ref 0.0–40.0)

## 2016-09-24 LAB — TSH: TSH: 0.99 u[IU]/mL (ref 0.35–4.50)

## 2016-09-24 LAB — HEMOGLOBIN A1C: HEMOGLOBIN A1C: 6.4 % (ref 4.6–6.5)

## 2016-09-26 ENCOUNTER — Other Ambulatory Visit: Payer: Self-pay

## 2016-09-26 ENCOUNTER — Telehealth: Payer: Self-pay

## 2016-09-26 NOTE — Telephone Encounter (Signed)
Gastroenterology Pre-Procedure Review  Request Date:  Requesting Physician: Dr.   PATIENT REVIEW QUESTIONS: The patient responded to the following health history questions as indicated:    1. Are you having any GI issues? no 2. Do you have a personal history of Polyps? no 3. Do you have a family history of Colon Cancer or Polyps? yes (brother and sister colon cancer) 4. Diabetes Mellitus? no 5. Joint replacements in the past 12 months?no 6. Major health problems in the past 3 months?no 7. Any artificial heart valves, MVP, or defibrillator?no    MEDICATIONS & ALLERGIES:    Patient reports the following regarding taking any anticoagulation/antiplatelet therapy:   Plavix, Coumadin, Eliquis, Xarelto, Lovenox, Pradaxa, Brilinta, or Effient? no Aspirin? no  Patient confirms/reports the following medications:  Current Outpatient Prescriptions  Medication Sig Dispense Refill  . ADVAIR DISKUS 100-50 MCG/DOSE AEPB INHALE ONE DOSE BY MOUTH EVERY 12 HOURS 60 each 3  . albuterol (PROAIR HFA) 108 (90 BASE) MCG/ACT inhaler Inhale 2 puffs into the lungs every 6 (six) hours as needed. For wheezing. 1 Inhaler 6   No current facility-administered medications for this visit.     Patient confirms/reports the following allergies:  No Known Allergies  No orders of the defined types were placed in this encounter.   AUTHORIZATION INFORMATION Primary Insurance: 1D#: Group #:  Secondary Insurance: 1D#: Group #:  SCHEDULE INFORMATION: Date: 10/10/16 Time: Location: [place]

## 2016-10-10 ENCOUNTER — Ambulatory Visit: Payer: Managed Care, Other (non HMO) | Admitting: Certified Registered"

## 2016-10-10 ENCOUNTER — Ambulatory Visit
Admission: RE | Admit: 2016-10-10 | Discharge: 2016-10-10 | Disposition: A | Discharge status: Home / Self Care | Payer: Managed Care, Other (non HMO) | Source: Ambulatory Visit | Attending: Gastroenterology | Admitting: Gastroenterology

## 2016-10-10 ENCOUNTER — Encounter: Payer: Self-pay | Admitting: *Deleted

## 2016-10-10 ENCOUNTER — Encounter
Admission: RE | Disposition: A | Discharge status: Home / Self Care | Payer: Self-pay | Source: Ambulatory Visit | Attending: Gastroenterology

## 2016-10-10 DIAGNOSIS — J45909 Unspecified asthma, uncomplicated: Secondary | ICD-10-CM | POA: Diagnosis not present

## 2016-10-10 DIAGNOSIS — Z1211 Encounter for screening for malignant neoplasm of colon: Secondary | ICD-10-CM | POA: Diagnosis not present

## 2016-10-10 DIAGNOSIS — K621 Rectal polyp: Secondary | ICD-10-CM

## 2016-10-10 DIAGNOSIS — K6389 Other specified diseases of intestine: Secondary | ICD-10-CM | POA: Insufficient documentation

## 2016-10-10 DIAGNOSIS — I10 Essential (primary) hypertension: Secondary | ICD-10-CM | POA: Insufficient documentation

## 2016-10-10 DIAGNOSIS — D123 Benign neoplasm of transverse colon: Secondary | ICD-10-CM | POA: Diagnosis not present

## 2016-10-10 DIAGNOSIS — Z800 Family history of malignant neoplasm of digestive organs: Secondary | ICD-10-CM | POA: Diagnosis not present

## 2016-10-10 DIAGNOSIS — D128 Benign neoplasm of rectum: Secondary | ICD-10-CM | POA: Insufficient documentation

## 2016-10-10 DIAGNOSIS — Z6841 Body Mass Index (BMI) 40.0 and over, adult: Secondary | ICD-10-CM | POA: Diagnosis not present

## 2016-10-10 HISTORY — PX: COLONOSCOPY WITH PROPOFOL: SHX5780

## 2016-10-10 SURGERY — COLONOSCOPY WITH PROPOFOL
Anesthesia: General

## 2016-10-10 MED ORDER — PROPOFOL 500 MG/50ML IV EMUL
INTRAVENOUS | Status: DC | PRN
  Administered 2016-10-10: 120 ug/kg/min via INTRAVENOUS

## 2016-10-10 MED ORDER — METHYLENE BLUE 0.5 % INJ SOLN
INTRAVENOUS | Status: AC
  Filled 2016-10-10: qty 10

## 2016-10-10 MED ORDER — LIDOCAINE 2% (20 MG/ML) 5 ML SYRINGE
INTRAMUSCULAR | Status: DC | PRN
  Administered 2016-10-10: 25 mg via INTRAVENOUS

## 2016-10-10 MED ORDER — MIDAZOLAM HCL 2 MG/2ML IJ SOLN
INTRAMUSCULAR | Status: AC
  Filled 2016-10-10: qty 2

## 2016-10-10 MED ORDER — PROPOFOL 500 MG/50ML IV EMUL
INTRAVENOUS | Status: AC
  Filled 2016-10-10: qty 50

## 2016-10-10 MED ORDER — MIDAZOLAM HCL 5 MG/5ML IJ SOLN
INTRAMUSCULAR | Status: DC | PRN
  Administered 2016-10-10: 2 mg via INTRAVENOUS

## 2016-10-10 MED ORDER — PROPOFOL 10 MG/ML IV BOLUS
INTRAVENOUS | Status: DC | PRN
  Administered 2016-10-10: 30 mg via INTRAVENOUS
  Administered 2016-10-10: 70 mg via INTRAVENOUS

## 2016-10-10 MED ORDER — SODIUM CHLORIDE 0.9 % IV SOLN
INTRAVENOUS | Status: DC
  Administered 2016-10-10: 11:00:00 via INTRAVENOUS

## 2016-10-10 NOTE — Anesthesia Preprocedure Evaluation (Addendum)
Anesthesia Evaluation  Patient identified by MRN, date of birth, ID band Patient awake    Reviewed: Allergy & Precautions, H&P , NPO status , Patient's Chart, lab work & pertinent test results, reviewed documented beta blocker date and time   History of Anesthesia Complications (+) AWARENESS UNDER ANESTHESIANegative for: history of anesthetic complications  Airway Mallampati: II  TM Distance: >3 FB Neck ROM: full    Dental no notable dental hx.    Pulmonary neg pulmonary ROS, asthma ,    Pulmonary exam normal breath sounds clear to auscultation       Cardiovascular Exercise Tolerance: Good hypertension, negative cardio ROS   Rhythm:regular Rate:Normal     Neuro/Psych negative neurological ROS  negative psych ROS   GI/Hepatic negative GI ROS, Neg liver ROS,   Endo/Other  negative endocrine ROSMorbid obesity  Renal/GU negative Renal ROS  negative genitourinary   Musculoskeletal negative musculoskeletal ROS (+)   Abdominal (+) + obese,   Peds negative pediatric ROS (+)  Hematology negative hematology ROS (+) anemia ,   Anesthesia Other Findings Past Medical History: 02/2009: Anemia No date: Asthma 2011: Blood transfusion     Comment: [place] No date: Morbid obesity ([place])  Reproductive/Obstetrics negative OB ROS                            Anesthesia Physical  Anesthesia Plan  ASA: III  Anesthesia Plan: General   Post-op Pain Management:    Induction: Intravenous  Airway Management Planned: Nasal Cannula  Additional Equipment:   Intra-op Plan:   Post-operative Plan:   Informed Consent: I have reviewed the patients History and Physical, chart, labs and discussed the procedure including the risks, benefits and alternatives for the proposed anesthesia with the patient or authorized representative who has indicated his/her understanding and acceptance.   Dental Advisory  Given  Plan Discussed with: CRNA and Surgeon  Anesthesia Plan Comments:         Anesthesia Quick Evaluation

## 2016-10-10 NOTE — OR Nursing (Signed)
15 ml methylene blue injected in hepatic flexure colon removal site

## 2016-10-10 NOTE — Anesthesia Post-op Follow-up Note (Cosign Needed)
Anesthesia QCDR form completed.        

## 2016-10-10 NOTE — Transfer of Care (Signed)
Immediate Anesthesia Transfer of Care Note  Patient: Stacy Meyer  Procedure(s) Performed: Procedure(s): COLONOSCOPY WITH PROPOFOL (N/A)  Patient Location: Endoscopy Unit  Anesthesia Type:General  Level of Consciousness: awake, alert  and oriented  Airway & Oxygen Therapy: Patient Spontanous Breathing and Patient connected to nasal cannula oxygen  Post-op Assessment: Report given to RN and Post -op Vital signs reviewed and stable  Post vital signs: Reviewed  Last Vitals:  Vitals:   10/10/16 1009 10/10/16 1242  BP: (!) 156/86 (!) 155/82  Pulse: 76 68  Resp: 15 17  Temp: 36.3 C     Last Pain:  Vitals:   10/10/16 1009  TempSrc: Tympanic         Complications: No apparent anesthesia complications

## 2016-10-10 NOTE — Op Note (Signed)
[facility] Gastroenterology Patient Name: Stacy Meyer Procedure Date: 10/10/2016 11:50 AM MRN: [id number] Account #: [id number] Date of Birth: January 19, 1961 Admit Type: Outpatient Age: 56 Room: [facility] ENDO ROOM 4 Gender: Female Note Status: Finalized Procedure:            Colonoscopy Indications:          Screening patient at increased risk: Family history of                        colorectal cancer in multiple 1st-degree relatives Providers:            Jonathon Bellows MD, MD Referring MD:         [name] (Referring MD) Medicines:            Monitored Anesthesia Care Complications:        No immediate complications. Procedure:            Pre-Anesthesia Assessment:                       - Prior to the procedure, a History and Physical was                        performed, and patient medications, allergies and                        sensitivities were reviewed. The patient's tolerance of                        previous anesthesia was reviewed.                       - The risks and benefits of the procedure and the                        sedation options and risks were discussed with the                        patient. All questions were answered and informed                        consent was obtained.                       - ASA Grade Assessment: III - A patient with severe                        systemic disease.                       After obtaining informed consent, the colonoscope was                        passed under direct vision. Throughout the procedure,                        the patient's blood pressure, pulse, and oxygen                        saturations were monitored continuously. The Olympus  CF-HQ190L Colonoscope (S#. [id number]) was introduced                        through the anus and advanced to the the cecum,                        identified by the appendiceal orifice, IC valve and   transillumination. The colonoscopy was performed with                        ease. The patient tolerated the procedure well. The                        quality of the bowel preparation was good. Findings:      The perianal and digital rectal examinations were normal.      A sessile and ulcerated non-obstructing large mass was found in the       cecum. The mass was partially circumferential (involving one-half of the       lumen circumference). The mass measured three cm in length. In addition,       its diameter measured three mm. No bleeding was present. No biopsies       were taken to avoid scar tissue which may render EMR or ESD harder      A 20 mm, non-bleeding polyp was found in the hepatic flexure. The polyp       was semi-pedunculated. The polyp was removed with a saline       injection-lift technique using a hot snare. Resection and retrieval were       complete. To prevent bleeding after the polypectomy, one hemostatic clip       was successfully placed. There was no bleeding at the end of the       procedure.      A 20 mm polyp was found in the rectum. The polyp was pedunculated. For       hemostasis, two hemostatic clips were successfully placed. There was no       bleeding at the end of the maneuver. The polyp was removed with a hot       snare. Resection and retrieval were complete.      The entire examined colon appeared normal on direct and retroflexion       views.      A submucosal non-obstructing small mass was found in the rectum. The       mass measured two cm in length. In addition, its diameter measured       twenty mm. No bleeding was present. Impression:           - Rule out malignancy, tumor in the cecum.                       - One 20 mm, non-bleeding polyp at the hepatic flexure,                        removed using injection-lift and a hot snare. Resected                        and retrieved. Clip was placed.                       - One 20 mm polyp in the  rectum, removed with a hot                        snare. Resected and retrieved. Clips were placed.                       - The entire examined colon is normal on direct and                        retroflexion views. Recommendation:       - Discharge patient to home (with escort).                       - Resume previous diet PRN.                       - Continue present medications.                       - No ibuprofen, naproxen, or other non-steroidal                        anti-inflammatory drugs for 4 weeks after polyp removal.                       - I would also suggest rectal ultrasound of the                        submucosal lesion , likely a lipoma Procedure Code(s):    --- Professional ---                       [phone], Colonoscopy, flexible; with removal of tumor(s),                        polyp(s), or other lesion(s) by snare technique                       45381, Colonoscopy, flexible; with directed submucosal                        injection(s), any substance Diagnosis Code(s):    --- Professional ---                       K62.1, Rectal polyp                       D12.3, Benign neoplasm of transverse colon (hepatic                        flexure or splenic flexure)                       D49.0, Neoplasm of unspecified behavior of digestive                        system                       Z80.0, Family history of malignant neoplasm of                        digestive organs CPT copyright 2016 American  Medical Association. All rights reserved. The codes documented in this report are preliminary and upon coder review may  be revised to meet current compliance requirements. Jonathon Bellows, MD Jonathon Bellows MD, MD 10/10/2016 12:42:21 PM This report has been signed electronically. Number of Addenda: 0 Note Initiated On: 10/10/2016 11:50 AM Scope Withdrawal Time: 0 hours 28 minutes 28 seconds  Total Procedure Duration: 0 hours 34 minutes 13 seconds       [facility]

## 2016-10-10 NOTE — Anesthesia Postprocedure Evaluation (Signed)
Anesthesia Post Note  Patient: RASHIKA BETTES  Procedure(s) Performed: Procedure(s) (LRB): COLONOSCOPY WITH PROPOFOL (N/A)  Patient location during evaluation: PACU Anesthesia Type: General Level of consciousness: awake and alert and oriented Pain management: pain level controlled Vital Signs Assessment: post-procedure vital signs reviewed and stable Respiratory status: spontaneous breathing Cardiovascular status: blood pressure returned to baseline Anesthetic complications: no     Last Vitals:  Vitals:   10/10/16 1302 10/10/16 1312  BP: (!) 144/77 (!) 150/92  Pulse: 67 63  Resp: 15 16  Temp:      Last Pain:  Vitals:   10/10/16 1242  TempSrc: Tympanic                 Azalynn Maxim

## 2016-10-10 NOTE — H&P (Signed)
  Jonathon Bellows [address], [place], [place] [address] Phone: [phone] Fax : [phone]  Primary Care Physician:  Sheral Flow, NP Primary Gastroenterologist:  Dr. Jonathon Bellows   Pre-Procedure History & Physical: HPI:  Stacy Meyer is a 56 y.o. female is here for an colonoscopy.   Past Medical History:  Diagnosis Date  . Anemia 02/2009  . Asthma   . Blood transfusion 2011   [place]  . Morbid obesity ([place])     Past Surgical History:  Procedure Laterality Date  . CARPAL TUNNEL RELEASE    . EXCISION OF GANGLION CYST    . TUBAL LIGATION  1988  . VAGINAL HYSTERECTOMY  12/01/2011   Procedure: HYSTERECTOMY VAGINAL;  Surgeon: Terrance Mass, MD;  Location: [facility];  Service: Gynecology;  Laterality: Right;    Prior to Admission medications   Medication Sig Start Date End Date Taking? Authorizing Provider  ADVAIR DISKUS 100-50 MCG/DOSE AEPB INHALE ONE DOSE BY MOUTH EVERY 12 HOURS 01/06/13  Yes Ria Bush, MD  albuterol (PROAIR HFA) 108 (90 BASE) MCG/ACT inhaler Inhale 2 puffs into the lungs every 6 (six) hours as needed. For wheezing. 01/06/13  Yes Ria Bush, MD    Allergies as of 09/26/2016  . (No Known Allergies)    Family History  Problem Relation Age of Onset  . Breast cancer Mother   . Diabetes Father   . Hypertension Father   . Heart disease Father   . Ovarian cancer Sister   . Cancer Brother     Oral  . Colon cancer Brother     Social History   Social History  . Marital status: Single    Spouse name: N/A  . Number of children: N/A  . Years of education: N/A   Occupational History  . Not on file.   Social History Main Topics  . Smoking status: Never Smoker  . Smokeless tobacco: Never Used  . Alcohol use Yes     Comment: rarely  . Drug use: No  . Sexual activity: No   Other Topics Concern  . Not on file   Social History Narrative   Single.   2 children, 1 grandchildren.   Works in [employer],  Network engineer job.   Enjoys relaxing.    Review of Systems: See HPI, otherwise negative ROS  Physical Exam: BP (!) 156/86   Pulse 76   Temp 97.3 F (36.3 C) (Tympanic)   Resp 15   Ht 5\' 4"  (1.626 m)   Wt (!) 344 lb (156 kg)   LMP 09/20/2011   SpO2 100%   BMI 59.05 kg/m  General:   Alert,  pleasant and cooperative in NAD Head:  Normocephalic and atraumatic. Neck:  Supple; no masses or thyromegaly. Lungs:  Clear throughout to auscultation.    Heart:  Regular rate and rhythm. Abdomen:  Soft, nontender and nondistended. Normal bowel sounds, without guarding, and without rebound.   Neurologic:  Alert and  oriented x4;  grossly normal neurologically.  Impression/Plan: [name] is here for an colonoscopy to be performed for colorectal cancer screening . Family history of colon cancer.   Risks, benefits, limitations, and alternatives regarding  colonoscopy have been reviewed with the patient.  Questions have been answered.  All parties agreeable.   Jonathon Bellows, MD  10/10/2016, 11:39 AM

## 2016-10-13 ENCOUNTER — Encounter: Payer: Self-pay | Admitting: Gastroenterology

## 2016-10-13 LAB — SURGICAL PATHOLOGY

## 2016-10-17 ENCOUNTER — Telehealth: Payer: Self-pay

## 2016-10-17 NOTE — Telephone Encounter (Signed)
Setup referral for pt to be seen at [facility]. Scheduled for 10/24/16.

## 2016-10-29 ENCOUNTER — Ambulatory Visit: Payer: Managed Care, Other (non HMO) | Attending: Primary Care

## 2016-10-29 DIAGNOSIS — M25561 Pain in right knee: Secondary | ICD-10-CM | POA: Insufficient documentation

## 2016-10-29 DIAGNOSIS — G8929 Other chronic pain: Secondary | ICD-10-CM | POA: Diagnosis present

## 2016-10-29 NOTE — Therapy (Signed)
[facility] [address], [place], [address] Phone: [phone]   Fax:  [phone]  Physical Therapy Evaluation  Patient Details  Name: Stacy Meyer MRN: [id number] Date of Birth: 22-Mar-1961 Referring Provider: Alma Friendly NP  Encounter Date: 10/29/2016      PT End of Session - 10/29/16 1647    Visit Number 1   Number of Visits 13   Date for PT Re-Evaluation 12/10/16   Authorization Type no g codes   PT Start Time 1520   PT Stop Time 1615   PT Time Calculation (min) 55 min   Activity Tolerance Patient tolerated treatment well   Behavior During Therapy [facility] for tasks assessed/performed      Past Medical History:  Diagnosis Date  . Anemia 02/2009  . Asthma   . Blood transfusion 2011   [place]  . Morbid obesity ([place])     Past Surgical History:  Procedure Laterality Date  . CARPAL TUNNEL RELEASE    . COLONOSCOPY WITH PROPOFOL N/A 10/10/2016   Procedure: COLONOSCOPY WITH PROPOFOL;  Surgeon: Jonathon Bellows, MD;  Location: [facility];  Service: Endoscopy;  Laterality: N/A;  . EXCISION OF GANGLION CYST    . TUBAL LIGATION  1988  . VAGINAL HYSTERECTOMY  12/01/2011   Procedure: HYSTERECTOMY VAGINAL;  Surgeon: Terrance Mass, MD;  Location: [facility];  Service: Gynecology;  Laterality: Right;    There were no vitals filed for this visit.       Subjective Assessment - 10/29/16 1537    Subjective R knee pain   Pertinent History Pt reports R knee pain since November 2017. Pt reports she suffered a fall in November when she felt like her knee "gave out." Denies knee trauma with the fall or any prior knee trauma. She states that the right knee pain actually started before the fall. Pt complains of popping, catching, and locking of R knee. She is only able to keep it bent for a brief period before needing to move her knee secondary to pain. Pt reports continuous swelling of knee. Pt works in a  [employer] where she sits in an awkward position with a box between her knees, which causes pain. She describes the pain as "shooting." Aggravating factors: climbing steps, squatting, cold weather; Easing: "staying off my knee," no improvement with ice/heat. Pain wakes her up at night. Worst: 8/10; Best: 0/10; Present: 2/10. Pt reports that pain runs into her calf. Denies N/T. Pt denies chills, fevers, night sweats, weight gain/loss, change in bowel bladder function. Pt denies back pain. Intermittent chronic bilateral hip pain but mostly only left side and does not coincide with knee pain   Limitations Sitting;Standing   How long can you sit comfortably? 15 minutes   How long can you stand comfortably? 30 minutes   How long can you walk comfortably? Unable to answer   Diagnostic tests Radiographs: WNL per subjective report   Patient Stated Goals Resume normal function without pain   Currently in Pain? Yes   Pain Score 2   Worst: 8/10; Best: 0/10   Pain Location Knee   Pain Orientation Right   Pain Descriptors / Indicators Shooting   Pain Type Chronic pain   Pain Radiating Towards R calf   Pain Onset More than a month ago   Pain Frequency Intermittent   Aggravating Factors  see history   Pain Relieving Factors see history   Multiple Pain Sites No  [facility] PT Assessment - 10/29/16 0001      Assessment   Medical Diagnosis R knee pain   Referring Provider Alma Friendly NP   Onset Date/Surgical Date 06/04/16  Approximate   Hand Dominance Right   Next MD Visit None scheduled   Prior Therapy None     Precautions   Precautions None     Restrictions   Weight Bearing Restrictions No     Balance Screen   Has the patient fallen in the past 6 months Yes   How many times? 1   Has the patient had a decrease in activity level because of a fear of falling?  Yes   Is the patient reluctant to leave their home because of a fear of falling?  No     Home Environment   Living  Environment Private residence   Living Arrangements Other relatives   Available Help at Discharge Family   Type of [place] to enter   Entrance Stairs-Number of Steps [place] One level     Prior Function   Level of Independence Independent   Vocation Full time employment   Vocation Requirements Sitting for extended periods   Leisure Reading     Cognition   Overall Cognitive Status Within Functional Limits for tasks assessed     Observation/Other Assessments   Other Surveys  Other Surveys   Lower Extremity Functional Scale  33/80     Sensation   Additional Comments Intact sensation throughout bilateral LEs to light touch     Posture/Postural Control   Posture Comments No gross abnormalities identified     ROM / Strength   AROM / PROM / Strength AROM;Strength     AROM   Overall AROM Comments Hip flexion and IR/ER appears groslsy WNL. Bilateral knee extension is full near 0 degrees, bilateral knee flexion is 110 degrees and limited by adiposity. Pt reports increase in R knee pain with overpressure with both flexion and extension. Positive clicking and pain with McMurray's test with medial bias. Negative Ege's test, positive Thessaly test at 20 degrees of flexion. Pt falls backwards onto table due to severe pain. Hamstrings mildly limited around 75 to 80 degrees bilaterally. Pat Patrick test is negative for pain with restriction secondary to adiposity     Strength   Overall Strength Comments Hip flex, knee flex/ext, and ankle DF 5/5. HIp IR/ER 4+/5 and painless. R hip abduction 4-/5     Palpation   Palpation comment Pt reports pain with relatively light palpation along medial and posteromedial R knee. Pain with palpation along medial hamstrings on right side. No pain with palpation along patellar tendon, quadricep tendon, or lateral joint line. Unable to truly palpate medial joint line due to irritability of pain         TREATMENT  Ther-ex Supine quad sets over towel 3s hold x 10; R sidelying hip abduction x 10; Hooklying bridges x 10; Pt issued HEP with extensive education about how to perform correctly.                    PT Education - 10/29/16 1647    Education provided Yes   Education Details HEP, plan of care   Person(s) Educated Patient   Methods Explanation   Comprehension Verbalized understanding             PT Long Term Goals - 10/29/16 1656  PT LONG TERM GOAL #1   Title Pt will be independent with HEP in order to improve strength and decrease pain to restore function at home and work.   Time 6   Period Weeks   Status New     PT LONG TERM GOAL #2   Title Pt will increase LEFS by at least 9 points in order to demonstrate significant improvement in lower extremity function.    Baseline 10/29/16: 33/80   Time 6   Period Weeks   Status New     PT LONG TERM GOAL #3   Title Pt will report worst R knee pain as 5/10 on NPRS in order to resume all household and work responsibilities with less pain   Baseline 10/29/16: worst: 8/10   Time 6   Period Weeks     PT LONG TERM GOAL #4   Title Pt will increase R hip abduction strength to at least 4/5 in order to improve single leg stability with stairs so patient experiences less knee pain   Baseline 10/29/16: R hip abduction 4-/5   Time 6   Period Weeks               Plan - 10/29/16 1649    Clinical Impression Statement Pt is a pleasant 56 yo [place] American female referred for R knee pain since November 2017. Pt complains of popping, catching, and locking of R knee as well as swelling of knee. PT examination reveals some positive tests for meniscal pathology including pain and audible clicking with medially biased McMurrays test. Pain with R knee flexion and extension overpressure. Positive Thessaly test at 20 degrees flexion with pt experiencing severe pain and falling back onto examination table. No  swelling appreciated during examination and palpation of medial joint line is limited due to highly irritable pain. Pt also reports pain with palpation of medial hamstring insertion. Pt provided home exercise program for hip and knee strengthening. She will benefit from skilled PT services to address deficits in R knee pain in order to return to full pain-free function at home and work   Rehab Potential Fair   Clinical Impairments Affecting Rehab Potential Positive: motivation, age; Negative: lack of improvement, obesity, highly irritable pain   PT Frequency 2x / week   PT Duration 6 weeks   PT Treatment/Interventions Aquatic Therapy;Cryotherapy;Electrical Stimulation;Moist Heat;Iontophoresis 4mg /ml Dexamethasone;Ultrasound;Gait training;Stair training;Therapeutic activities;Therapeutic exercise;Balance training;Neuromuscular re-education;Patient/family education;Manual techniques;Passive range of motion;Dry needling   PT Next Visit Plan Assess gait, perform step down test/stairs, progress RLE strengthening, gentle STM R medial knee   PT Home Exercise Plan Quad set over towel 2 x 10 BID, sidleying hip abduction 2 x 10 BID, Hooklying bridges 2 x 10 BID   Consulted and Agree with Plan of Care Patient      Patient will benefit from skilled therapeutic intervention in order to improve the following deficits and impairments:  Pain, Obesity, Decreased strength  Visit Diagnosis: Chronic pain of right knee - Plan: PT plan of care cert/re-cert     Problem List Patient Active Problem List   Diagnosis Date Noted  . Pedal edema 01/06/2013  . Chronic pain of right knee 01/06/2013  . Morbid obesity ([place])   . Asthma   . ALLERGIC RHINITIS CAUSE UNSPECIFIED 03/22/2010  . ELEVATED BLOOD PRESSURE WITHOUT DIAGNOSIS OF HYPERTENSION 03/22/2010   Phillips Grout PT, DPT   Reno Clasby 10/29/2016, 5:05 PM  [facility] [address]  [place], [place], [address] Phone: [phone]   Fax:  [phone]  Name: Stacy Meyer MRN: [id number] Date of Birth: [birth year]

## 2016-11-03 ENCOUNTER — Ambulatory Visit: Payer: Managed Care, Other (non HMO) | Attending: Internal Medicine

## 2016-11-03 DIAGNOSIS — M25561 Pain in right knee: Secondary | ICD-10-CM | POA: Insufficient documentation

## 2016-11-03 DIAGNOSIS — G8929 Other chronic pain: Secondary | ICD-10-CM | POA: Diagnosis present

## 2016-11-03 NOTE — Therapy (Signed)
[facility] [address], [place], [address] Phone: [phone]   Fax:  [phone]  Physical Therapy Treatment  Patient Details  Name: Stacy Meyer MRN: [id number] Date of Birth: 1961-02-12 Referring Provider: Alma Friendly NP  Encounter Date: 11/03/2016      PT End of Session - 11/03/16 1546    Visit Number 2   Number of Visits 13   Date for PT Re-Evaluation 12/10/16   Authorization Type no g codes   PT Start Time 4401   PT Stop Time 1610   PT Time Calculation (min) 28 min   Activity Tolerance Patient tolerated treatment well   Behavior During Therapy [facility] for tasks assessed/performed      Past Medical History:  Diagnosis Date  . Anemia 02/2009  . Asthma   . Blood transfusion 2011   [place]  . Morbid obesity ([place])     Past Surgical History:  Procedure Laterality Date  . CARPAL TUNNEL RELEASE    . COLONOSCOPY WITH PROPOFOL N/A 10/10/2016   Procedure: COLONOSCOPY WITH PROPOFOL;  Surgeon: Jonathon Bellows, MD;  Location: [facility] END[facility];  Service: Endoscopy;  Laterality: N/A;  . EXCISION OF GANGLION CYST    . TUBAL LIGATION  1988  . VAGINAL HYSTERECTOMY  12/01/2011   Procedure: HYSTERECTOMY VAGINAL;  Surgeon: Terrance Mass, MD;  Location: [facility];  Service: Gynecology;  Laterality: Right;    There were no vitals filed for this visit.      Subjective Assessment - 11/03/16 1544    Subjective Pt reports no R knee pain on this date. She is having increased back pain today which is limiting her activity. No specific questions or concerns at this time. Reports compliance with HEP.    Pertinent History Pt reports R knee pain since November 2017. Pt reports she suffered a fall in November when she felt like her knee "gave out." Denies knee trauma with the fall or any prior knee trauma. She states that the right knee pain actually started before the fall. Pt complains of popping, catching, and locking  of R knee. She is only able to keep it bent for a brief period before needing to move her knee secondary to pain. Pt reports continuous swelling of knee. Pt works in a [employer] where she sits in an awkward position with a box between her knees, which causes pain. She describes the pain as "shooting." Aggravating factors: climbing steps, squatting, cold weather; Easing: "staying off my knee," no improvement with ice/heat. Pain wakes her up at night. Worst: 8/10; Best: 0/10; Present: 2/10. Pt reports that pain runs into her calf. Denies N/T. Pt denies chills, fevers, night sweats, weight gain/loss, change in bowel bladder function. Pt denies back pain. Intermittent chronic bilateral hip pain but mostly only left side and does not coincide with knee pain   Limitations Sitting;Standing   How long can you sit comfortably? 15 minutes   How long can you stand comfortably? 30 minutes   How long can you walk comfortably? Unable to answer   Diagnostic tests Radiographs: WNL per subjective report   Patient Stated Goals Resume normal function without pain   Currently in Pain? Yes   Pain Score 10-Worst pain ever   Pain Location Back   Pain Orientation Lower   Pain Descriptors / Indicators Shooting  Denies N/T or bowel/bladder changes   Pain Type Acute pain  Started Saturday   Pain Onset More than a month  ago   Pain Frequency Intermittent              TREATMENT  Ther-ex R Quad set over towel 2 x 15; R SLR 2 x 15; Hooklying bridges x 5, discontinued secondary to pain; L sidelying hip R hip abduction 2 x 15;  Manual Therapy STM to medial R knee and medial hamstrings x 3 minutes, pt with notable medial joint line tenderness today;                   PT Education - 11/03/16 1546    Education provided Yes   Education Details HEP reinforced, added SLR   Person(s) Educated Patient   Methods Explanation   Comprehension Verbalized understanding             PT Long Term  Goals - 10/29/16 1656      PT LONG TERM GOAL #1   Title Pt will be independent with HEP in order to improve strength and decrease pain to restore function at home and work.   Time 6   Period Weeks   Status New     PT LONG TERM GOAL #2   Title Pt will increase LEFS by at least 9 points in order to demonstrate significant improvement in lower extremity function.    Baseline 10/29/16: 33/80   Time 6   Period Weeks   Status New     PT LONG TERM GOAL #3   Title Pt will report worst R knee pain as 5/10 on NPRS in order to resume all household and work responsibilities with less pain   Baseline 10/29/16: worst: 8/10   Time 6   Period Weeks     PT LONG TERM GOAL #4   Title Pt will increase R hip abduction strength to at least 4/5 in order to improve single leg stability with stairs so patient experiences less knee pain   Baseline 10/29/16: R hip abduction 4-/5   Time 6   Period Weeks               Plan - 11/03/16 1547    Clinical Impression Statement Pt arrived late for her appointment so session is abbreviated accordinately. Pt is very limited during session today secondary to increased back pain. Progressed HEP to increase repetitions to 15 for all exercises and added R SLR. Pt encouraged to follow-up as scheduled. Pt will continue to benefit from skilled PT services to address deficits in pain and strength.    Rehab Potential Fair   Clinical Impairments Affecting Rehab Potential Positive: motivation, age; Negative: lack of improvement, obesity, highly irritable pain   PT Frequency 2x / week   PT Duration 6 weeks   PT Treatment/Interventions Aquatic Therapy;Cryotherapy;Electrical Stimulation;Moist Heat;Iontophoresis 4mg /ml Dexamethasone;Ultrasound;Gait training;Stair training;Therapeutic activities;Therapeutic exercise;Balance training;Neuromuscular re-education;Patient/family education;Manual techniques;Passive range of motion;Dry needling   PT Next Visit Plan Assess gait, perform  step down test/stairs, progress RLE strengthening, gentle STM R medial knee   PT Home Exercise Plan Quad set over towel 2 x 15 BID, SLR 2 x 15 BID, sidleying hip abduction 2 x 15 BID, Hooklying bridges 2 x 15 BID   Consulted and Agree with Plan of Care Patient      Patient will benefit from skilled therapeutic intervention in order to improve the following deficits and impairments:  Pain, Obesity, Decreased strength  Visit Diagnosis: Chronic pain of right knee     Problem List Patient Active Problem List   Diagnosis Date Noted  . Pedal  edema 01/06/2013  . Chronic pain of right knee 01/06/2013  . Morbid obesity ([place])   . Asthma   . ALLERGIC RHINITIS CAUSE UNSPECIFIED 03/22/2010  . ELEVATED BLOOD PRESSURE WITHOUT DIAGNOSIS OF HYPERTENSION 03/22/2010   Phillips Grout PT, DPT   Jesslynn Kruck 11/03/2016, 4:22 PM  [facility] [address] [place], [address] Phone: [phone]   Fax:  [phone]  Name: Stacy Meyer MRN: [id number] Date of Birth: May 05, 1961

## 2016-11-05 ENCOUNTER — Ambulatory Visit: Payer: Managed Care, Other (non HMO)

## 2016-11-05 DIAGNOSIS — G8929 Other chronic pain: Secondary | ICD-10-CM

## 2016-11-05 DIAGNOSIS — M25561 Pain in right knee: Principal | ICD-10-CM

## 2016-11-05 NOTE — Therapy (Signed)
[facility] [address], [place], [address] Phone: [phone]   Fax:  [phone]  Physical Therapy Treatment  Patient Details  Name: Stacy Meyer MRN: [id number] Date of Birth: 1961-03-08 Referring Provider: Alma Friendly NP  Encounter Date: 11/05/2016      PT End of Session - 11/05/16 1534    Visit Number 3   Number of Visits 13   Date for PT Re-Evaluation 12/10/16   Authorization Type no g codes   PT Start Time 6734   PT Stop Time 1615   PT Time Calculation (min) 40 min   Activity Tolerance Patient tolerated treatment well   Behavior During Therapy [facility] for tasks assessed/performed      Past Medical History:  Diagnosis Date  . Anemia 02/2009  . Asthma   . Blood transfusion 2011   [place]  . Morbid obesity ([place])     Past Surgical History:  Procedure Laterality Date  . CARPAL TUNNEL RELEASE    . COLONOSCOPY WITH PROPOFOL N/A 10/10/2016   Procedure: COLONOSCOPY WITH PROPOFOL;  Surgeon: Jonathon Bellows, MD;  Location: [facility];  Service: Endoscopy;  Laterality: N/A;  . EXCISION OF GANGLION CYST    . TUBAL LIGATION  1988  . VAGINAL HYSTERECTOMY  12/01/2011   Procedure: HYSTERECTOMY VAGINAL;  Surgeon: Terrance Mass, MD;  Location: [facility];  Service: Gynecology;  Laterality: Right;    There were no vitals filed for this visit.      Subjective Assessment - 11/05/16 1533    Subjective Pt denies pain currently in R knee. States that she was having some pain earlier this morning but she took Aleve and it improved. No specific questions or concerns at this time. She is performing HEP.    Pertinent History Pt reports R knee pain since November 2017. Pt reports she suffered a fall in November when she felt like her knee "gave out." Denies knee trauma with the fall or any prior knee trauma. She states that the right knee pain actually started before the fall. Pt complains of popping, catching,  and locking of R knee. She is only able to keep it bent for a brief period before needing to move her knee secondary to pain. Pt reports continuous swelling of knee. Pt works in a [employer] where she sits in an awkward position with a box between her knees, which causes pain. She describes the pain as "shooting." Aggravating factors: climbing steps, squatting, cold weather; Easing: "staying off my knee," no improvement with ice/heat. Pain wakes her up at night. Worst: 8/10; Best: 0/10; Present: 2/10. Pt reports that pain runs into her calf. Denies N/T. Pt denies chills, fevers, night sweats, weight gain/loss, change in bowel bladder function. Pt denies back pain. Intermittent chronic bilateral hip pain but mostly only left side and does not coincide with knee pain   Limitations Sitting;Standing   How long can you sit comfortably? 15 minutes   How long can you stand comfortably? 30 minutes   How long can you walk comfortably? Unable to answer   Diagnostic tests Radiographs: WNL per subjective report   Patient Stated Goals Resume normal function without pain   Currently in Pain? No/denies   Pain Onset --            TREATMENT  Ther-ex R Quad set over towel x 15, no pain; R SLR 2 x 15, no pain; Hooklying bridges 3 x 10, no pain; R HS  stretch 30s hold x 3; L sidelying hip R hip abduction 2 x 15; Prone R eccentric hamstring lowering with manual resistance 2 x 10; Prone R hip extension 2 x 10; Checked hip IR/ER and appears to be grossly WFL on R side; Standing R hip abduction and extension with 3# ankle weight 2 x 15 each; Cold pack applied to R knee following exercises x 5 minutes (unbilled); Pt provided written HEP and review with therapist to ensure understanding, issued green theraband;  Manual Therapy STM to medial R knee and medial hamstrings x 8 minutes, pt with notable tenderness to medial hamstring;                     PT Education - 11/05/16 1534    Education  provided Yes   Education Details HEP reinforced   Person(s) Educated Patient   Methods Explanation   Comprehension Verbalized understanding             PT Long Term Goals - 10/29/16 1656      PT LONG TERM GOAL #1   Title Pt will be independent with HEP in order to improve strength and decrease pain to restore function at home and work.   Time 6   Period Weeks   Status New     PT LONG TERM GOAL #2   Title Pt will increase LEFS by at least 9 points in order to demonstrate significant improvement in lower extremity function.    Baseline 10/29/16: 33/80   Time 6   Period Weeks   Status New     PT LONG TERM GOAL #3   Title Pt will report worst R knee pain as 5/10 on NPRS in order to resume all household and work responsibilities with less pain   Baseline 10/29/16: worst: 8/10   Time 6   Period Weeks     PT LONG TERM GOAL #4   Title Pt will increase R hip abduction strength to at least 4/5 in order to improve single leg stability with stairs so patient experiences less knee pain   Baseline 10/29/16: R hip abduction 4-/5   Time 6   Period Weeks               Plan - 11/05/16 1534    Clinical Impression Statement Pt reports pain reproduction today in medial posterior knee with hamstring stretch. Her knee pain may be related to a possible medial hamstring strain. Will continue with gentle HS stretches, strengthening, and massage. Pt encouraged to continue HEP and follow-up as scheduled.    Rehab Potential Fair   Clinical Impairments Affecting Rehab Potential Positive: motivation, age; Negative: lack of improvement, obesity, highly irritable pain   PT Frequency 2x / week   PT Duration 6 weeks   PT Treatment/Interventions Aquatic Therapy;Cryotherapy;Electrical Stimulation;Moist Heat;Iontophoresis 4mg /ml Dexamethasone;Ultrasound;Gait training;Stair training;Therapeutic activities;Therapeutic exercise;Balance training;Neuromuscular re-education;Patient/family education;Manual  techniques;Passive range of motion;Dry needling   PT Next Visit Plan Assess gait, perform step down test/stairs, progress RLE strengthening, gentle STM R medial knee   PT Home Exercise Plan SLR 2 x 15 BID, Hooklying bridges 2 x 15 BID, standing hip abduction green tband 2 x 15, standing hip extension green tband 2 x 15;   Consulted and Agree with Plan of Care Patient      Patient will benefit from skilled therapeutic intervention in order to improve the following deficits and impairments:  Pain, Obesity, Decreased strength  Visit Diagnosis: Chronic pain of right knee  Problem List Patient Active Problem List   Diagnosis Date Noted  . Pedal edema 01/06/2013  . Chronic pain of right knee 01/06/2013  . Morbid obesity ([place])   . Asthma   . ALLERGIC RHINITIS CAUSE UNSPECIFIED 03/22/2010  . ELEVATED BLOOD PRESSURE WITHOUT DIAGNOSIS OF HYPERTENSION 03/22/2010   Lyndel Safe Huprich PT, DPT   Huprich,Jason 11/06/2016, 10:26 AM  [facility] [address], [place], [address] Phone: [phone]   Fax:  [phone]  Name: Stacy Meyer MRN: [id number] Date of Birth: [birth year]

## 2016-11-10 ENCOUNTER — Ambulatory Visit: Payer: Managed Care, Other (non HMO)

## 2016-11-12 ENCOUNTER — Ambulatory Visit: Payer: Managed Care, Other (non HMO) | Admitting: Physical Therapy

## 2016-11-17 ENCOUNTER — Other Ambulatory Visit: Payer: Self-pay | Admitting: Primary Care

## 2016-11-17 DIAGNOSIS — Z1231 Encounter for screening mammogram for malignant neoplasm of breast: Secondary | ICD-10-CM

## 2016-11-25 ENCOUNTER — Ambulatory Visit: Payer: Managed Care, Other (non HMO)

## 2016-11-25 ENCOUNTER — Telehealth: Payer: Self-pay

## 2016-11-25 NOTE — Telephone Encounter (Signed)
No show. Called patient who said that she got the days mixed up and thought that her appointment is tomorrow. Does not know if she will be able to make it to Thursday's session. Will call back to let us know. Phone number [phone]) provided. Pt states that she is currently at the hospital getting tests done to see if she has cancer.

## 2016-11-27 ENCOUNTER — Ambulatory Visit: Payer: Managed Care, Other (non HMO)

## 2016-12-01 ENCOUNTER — Ambulatory Visit: Payer: Managed Care, Other (non HMO)

## 2016-12-03 ENCOUNTER — Ambulatory Visit: Payer: Managed Care, Other (non HMO)

## 2016-12-04 ENCOUNTER — Ambulatory Visit
Admission: RE | Admit: 2016-12-04 | Discharge: 2016-12-04 | Disposition: A | Discharge status: Home / Self Care | Payer: Managed Care, Other (non HMO) | Source: Ambulatory Visit | Attending: Primary Care | Admitting: Primary Care

## 2016-12-04 DIAGNOSIS — Z1231 Encounter for screening mammogram for malignant neoplasm of breast: Secondary | ICD-10-CM

## 2016-12-15 ENCOUNTER — Ambulatory Visit: Payer: Managed Care, Other (non HMO) | Attending: Primary Care

## 2016-12-18 ENCOUNTER — Ambulatory Visit: Payer: Managed Care, Other (non HMO)

## 2016-12-22 ENCOUNTER — Other Ambulatory Visit (INDEPENDENT_AMBULATORY_CARE_PROVIDER_SITE_OTHER): Payer: Managed Care, Other (non HMO)

## 2016-12-22 DIAGNOSIS — R7303 Prediabetes: Secondary | ICD-10-CM | POA: Diagnosis not present

## 2016-12-22 LAB — HEMOGLOBIN A1C: Hgb A1c MFr Bld: 6.2 % (ref 4.6–6.5)

## 2017-01-29 ENCOUNTER — Encounter: Payer: Self-pay | Admitting: Family Medicine

## 2017-01-29 ENCOUNTER — Ambulatory Visit (INDEPENDENT_AMBULATORY_CARE_PROVIDER_SITE_OTHER): Payer: Managed Care, Other (non HMO) | Admitting: Family Medicine

## 2017-01-29 VITALS — BP 170/88 | HR 87 | Ht 64.0 in | Wt 347.0 lb

## 2017-01-29 DIAGNOSIS — I10 Essential (primary) hypertension: Secondary | ICD-10-CM | POA: Diagnosis not present

## 2017-01-29 DIAGNOSIS — J302 Other seasonal allergic rhinitis: Secondary | ICD-10-CM | POA: Diagnosis not present

## 2017-01-29 DIAGNOSIS — J4520 Mild intermittent asthma, uncomplicated: Secondary | ICD-10-CM

## 2017-01-29 MED ORDER — ALBUTEROL SULFATE HFA 108 (90 BASE) MCG/ACT IN AERS: 2.0000 | INHALATION_SPRAY | Freq: Four times a day (QID) | RESPIRATORY_TRACT | 6 refills | Status: DC | PRN

## 2017-01-29 MED ORDER — AMLODIPINE BESYLATE 5 MG PO TABS
5.0000 mg | ORAL_TABLET | Freq: Every day | ORAL | 3 refills | Status: DC
Start: 2017-01-29 — End: 2017-02-16

## 2017-01-29 MED ORDER — FLUTICASONE-SALMETEROL 100-50 MCG/DOSE IN AEPB: INHALATION_SPRAY | RESPIRATORY_TRACT | 6 refills | Status: DC

## 2017-01-29 MED ORDER — FLUTICASONE-SALMETEROL 100-50 MCG/DOSE IN AEPB: INHALATION_SPRAY | RESPIRATORY_TRACT | 3 refills | Status: DC

## 2017-01-29 NOTE — Assessment & Plan Note (Signed)
Anticipate precipitating more difficulty with asthma.  Pt states intranasal steroids cause epistaxis. rec nasal saline. Discussed allergen avoidance.

## 2017-01-29 NOTE — Assessment & Plan Note (Signed)
bp persistently elevated. Discussed reasons to treat hypertension. Pt agrees to start amlodipine 5mg  daily, will f/u with PCP in 2-3 wks.

## 2017-01-29 NOTE — Patient Instructions (Signed)
I do think allergies are causing asthma irritation - restart advair and albuterol inhaler - maybe schedule albuterol over next 3 days.  Nasal saline irrigation for allergies, avoid heat. Return if not improving with this. I do want you to return in 2-3 weeks for follow up with Anda Kraft on blood pressure and allergies.

## 2017-01-29 NOTE — Progress Notes (Signed)
BP (!) 170/88 (BP Location: Left Arm, Patient Position: Sitting, Cuff Size: Large)   Pulse 87   Ht 5\' 4"  (1.626 m)   Wt (!) 347 lb (157.4 kg)   LMP 09/20/2011   SpO2 98%   BMI 59.56 kg/m   On repeat, 180/98  CC: asthma, allergies flaring up this week Subjective:    Patient ID: Stacy Meyer, female    DOB: 24-May-1961, 56 y.o.   MRN: [id number]  HPI: Stacy Meyer is a 56 y.o. female presenting on 01/29/2017 for Allergies and Asthma   1 wk h/o seasonal allergy flare - started with itchy watery eyes and sneezing, this has caused increased trouble with asthma due to increased cough, congestion of yellow mucous. More trouble with heat. Head > chest congestion. Rare wheeze. + dyspnea, increased productive cough. Increased facial pressure on right  She prior was on advair and albuterol proair inhaler. Has not need for last 8 yrs.  Denies fevers. No sick contacts at home.   Blood pressure elevated, pt attributes to head congestion.   Relevant past medical, surgical, family and social history reviewed and updated as indicated. Interim medical history since our last visit reviewed. Allergies and medications reviewed and updated. Outpatient Medications Prior to Visit  Medication Sig Dispense Refill  . ADVAIR DISKUS 100-50 MCG/DOSE AEPB INHALE ONE DOSE BY MOUTH EVERY 12 HOURS 60 each 3  . albuterol (PROAIR HFA) 108 (90 BASE) MCG/ACT inhaler Inhale 2 puffs into the lungs every 6 (six) hours as needed. For wheezing. 1 Inhaler 6   No facility-administered medications prior to visit.      Per HPI unless specifically indicated in ROS section below Review of Systems     Objective:    BP (!) 170/88 (BP Location: Left Arm, Patient Position: Sitting, Cuff Size: Large)   Pulse 87   Ht 5\' 4"  (1.626 m)   Wt (!) 347 lb (157.4 kg)   LMP 09/20/2011   SpO2 98%   BMI 59.56 kg/m   Wt Readings from Last 3 Encounters:  01/29/17 (!) 347 lb (157.4 kg)  10/10/16 (!) 344 lb (156 kg)    09/23/16 (!) 344 lb 12.8 oz (156.4 kg)    BP Readings from Last 3 Encounters:  01/29/17 (!) 170/88  10/10/16 (!) 150/92  09/23/16 (!) 146/82    Physical Exam  Constitutional: She appears well-developed and well-nourished. No distress.  HENT:  Head: Normocephalic and atraumatic.  Right Ear: Hearing and external ear normal.  Left Ear: Hearing and external ear normal.  Nose: Mucosal edema present. No rhinorrhea. Right sinus exhibits no maxillary sinus tenderness and no frontal sinus tenderness. Left sinus exhibits no maxillary sinus tenderness and no frontal sinus tenderness.  Mouth/Throat: Uvula is midline, oropharynx is clear and moist and mucous membranes are normal. No oropharyngeal exudate, posterior oropharyngeal edema, posterior oropharyngeal erythema or tonsillar abscesses.  Left > right nasal mucosal congestion  Eyes: Conjunctivae and EOM are normal. Pupils are equal, round, and reactive to light. No scleral icterus.  Neck: Normal range of motion. Neck supple.  Cardiovascular: Normal rate, regular rhythm, normal heart sounds and intact distal pulses.   No murmur heard. Pulmonary/Chest: Effort normal and breath sounds normal. No respiratory distress. She has no wheezes. She has no rales.  No wheezing appreciated  Lymphadenopathy:    She has no cervical adenopathy.  Skin: Skin is warm and dry. No rash noted.  Nursing note and vitals reviewed.  Results for orders placed or performed  in visit on 12/22/16  Hemoglobin A1c  Result Value Ref Range   Hgb A1c MFr Bld 6.2 4.6 - 6.5 %      Assessment & Plan:   Problem List Items Addressed This Visit    Allergic rhinitis    Anticipate precipitating more difficulty with asthma.  Pt states intranasal steroids cause epistaxis. rec nasal saline. Discussed allergen avoidance.       Hypertension, essential    bp persistently elevated. Discussed reasons to treat hypertension. Pt agrees to start amlodipine 5mg  daily, will f/u with PCP  in 2-3 wks.       Relevant Medications   amLODipine (NORVASC) 5 MG tablet   Mild intermittent asthma - Primary    Anticipate more trouble with dyspnea recently due to allergic rhinitis flare. No obvious asthma flare today, no signs of infection. Restart advair scheduled, as well as albuterol scheduled over next 2-3 days. Update if persistent sxs despite restarting controller medication. Pt agrees with plan.       Relevant Medications   albuterol (PROAIR HFA) 108 (90 Base) MCG/ACT inhaler   Fluticasone-Salmeterol (ADVAIR DISKUS) 100-50 MCG/DOSE AEPB       Follow up plan: Return if symptoms worsen or fail to improve.  Ria Bush, MD

## 2017-01-29 NOTE — Assessment & Plan Note (Signed)
Anticipate more trouble with dyspnea recently due to allergic rhinitis flare. No obvious asthma flare today, no signs of infection. Restart advair scheduled, as well as albuterol scheduled over next 2-3 days. Update if persistent sxs despite restarting controller medication. Pt agrees with plan.

## 2017-02-02 ENCOUNTER — Telehealth: Payer: Self-pay | Admitting: Primary Care

## 2017-02-02 NOTE — Telephone Encounter (Signed)
Pt dropped off FMLA paperwork She stated when her asthma flares up she is out of work 2 days at a time. She was out of work in [place] for 2 days may for 2 days and 1 day in June.  Dr g can you fill out paperwork since you saw her 6/28 or do you want kate to.  Pt stated she needed paperwork filled out by 6/12 Thanks  In dr g in box

## 2017-02-06 NOTE — Telephone Encounter (Signed)
Filled and in my out box. Thanks.

## 2017-02-10 NOTE — Telephone Encounter (Signed)
FMLA was faxed to [employer] ([employer]) Fax # [phone]  Pt aware and copy for her up front Copy for scan Copy for file No charge per Dr. Danise Mina

## 2017-02-16 ENCOUNTER — Ambulatory Visit (INDEPENDENT_AMBULATORY_CARE_PROVIDER_SITE_OTHER): Payer: Managed Care, Other (non HMO) | Admitting: Primary Care

## 2017-02-16 ENCOUNTER — Encounter: Payer: Self-pay | Admitting: Primary Care

## 2017-02-16 VITALS — BP 142/78 | HR 75 | Temp 98.0°F | Ht 64.0 in | Wt 346.8 lb

## 2017-02-16 DIAGNOSIS — R7303 Prediabetes: Secondary | ICD-10-CM | POA: Insufficient documentation

## 2017-02-16 DIAGNOSIS — J302 Other seasonal allergic rhinitis: Secondary | ICD-10-CM | POA: Diagnosis not present

## 2017-02-16 DIAGNOSIS — E119 Type 2 diabetes mellitus without complications: Secondary | ICD-10-CM | POA: Insufficient documentation

## 2017-02-16 DIAGNOSIS — J4520 Mild intermittent asthma, uncomplicated: Secondary | ICD-10-CM

## 2017-02-16 DIAGNOSIS — I10 Essential (primary) hypertension: Secondary | ICD-10-CM

## 2017-02-16 MED ORDER — AMLODIPINE BESYLATE 10 MG PO TABS: 10.0000 mg | ORAL_TABLET | Freq: Every day | ORAL | 0 refills | Status: DC

## 2017-02-16 NOTE — Assessment & Plan Note (Signed)
Using albuterol too often, asthma doesn't seem well controlled. Discussed to use Advair daily, once to twice daily and use albuterol PRN for breakthrough SOB. She is taking an antihistamine, continue this. She will update if symptoms persist. Consider Singulair if no improvement.

## 2017-02-16 NOTE — Assessment & Plan Note (Signed)
Long discussion today regarding diet and need for regular exercise. Offered nutrition referral for which she kindly declined. Information provided regarding healthy diet and recommendations for exercise.

## 2017-02-16 NOTE — Assessment & Plan Note (Signed)
Continue OTC antihistamine. Will have her start daily Advair as this is best practice, use Albuterol PRN.

## 2017-02-16 NOTE — Progress Notes (Signed)
Subjective:    Patient ID: Stacy Meyer, female    DOB: 1961-01-09, 56 y.o.   MRN: [id number]  HPI  Stacy Meyer is a 56 year old female who presents today for follow up.  1) Essential Hypertension: Currently managed on Amlodipine 5 mg that was initiated in late June 2018 due to persistent elevated readings. Her BP in the office today is 142/78. She's checking her BP at home with a wrist cuff and getting readings of 130-150/70-80's. She denies headaches, dizziness, increased ankle edema.   BP Readings from Last 3 Encounters:  02/16/17 (!) 142/78  01/29/17 (!) 170/88  10/10/16 (!) 150/92     2) Asthma/Allergies: Evaluated in late June 2018 with complaints of dyspnea. During that visit her Advair was restarted as a scheduled medication with recommendations for a short term use of schedule albuterol.  Since her last visit she used her Advair daily for about 6 days in a row, now using twice weekly on average. She's using the albuterol inhaler every 4-6 hours daily. She denies wheezing. Her symptoms are worse during summer months, especially since she works in the plant at work.  3) Morbid Obesity: Long history of obesity. She endorses a poor diet and does not exercise. She is frustrated with her lack of weight loss despite efforts in the past to improve her diet.  Diet currently consists of:  Breakfast: Skips, but will sometime eat fast food Lunch: Pasta, crackers, tuna/chicken salad, tossed salad, grilled/fried chicken, veggies. Dinner: Skips, pasta, meat, veggies Snacks: None Desserts: 2-3 times weekly Beverages: Water, ginger-ale, sweet tea  Exercise: She does not exercise.  Lab Results  Component Value Date   HGBA1C 6.2 12/22/2016     Review of Systems  Constitutional: Negative for fatigue.  HENT: Positive for congestion.   Eyes: Negative for visual disturbance.  Respiratory: Negative for cough and wheezing.        Shortness of breath overall improved.    Cardiovascular: Negative for chest pain.  Allergic/Immunologic: Positive for environmental allergies.  Neurological: Negative for dizziness and headaches.       Past Medical History:  Diagnosis Date  . Anemia 02/2009  . Asthma   . Blood transfusion 2011   [name]  . Morbid obesity ([name])      Social History   Social History  . Marital status: Single    Spouse name: N/A  . Number of children: N/A  . Years of education: N/A   Occupational History  . Not on file.   Social History Main Topics  . Smoking status: Never Smoker  . Smokeless tobacco: Never Used  . Alcohol use Yes     Comment: rarely  . Drug use: No  . Sexual activity: No   Other Topics Concern  . Not on file   Social History Narrative   Single.   2 children, 1 grandchildren.   Works in [employer], Network engineer job.   Enjoys relaxing.    Past Surgical History:  Procedure Laterality Date  . CARPAL TUNNEL RELEASE    . COLONOSCOPY WITH PROPOFOL N/A 10/10/2016   Procedure: COLONOSCOPY WITH PROPOFOL;  Surgeon: Jonathon Bellows, MD;  Location: [facility];  Service: Endoscopy;  Laterality: N/A;  . EXCISION OF GANGLION CYST    . TUBAL LIGATION  1988  . VAGINAL HYSTERECTOMY  12/01/2011   Procedure: HYSTERECTOMY VAGINAL;  Surgeon: Terrance Mass, MD;  Location: [facility];  Service: Gynecology;  Laterality: Right;    Family History  Problem  Relation Age of Onset  . Breast cancer Mother   . Diabetes Father   . Hypertension Father   . Heart disease Father   . Ovarian cancer Sister   . Cancer Brother        Oral  . Colon cancer Brother     No Known Allergies  Current Outpatient Prescriptions on File Prior to Visit  Medication Sig Dispense Refill  . albuterol (PROAIR HFA) 108 (90 Base) MCG/ACT inhaler Inhale 2 puffs into the lungs every 6 (six) hours as needed. For wheezing. 1 Inhaler 6  . Fluticasone-Salmeterol (ADVAIR DISKUS) 100-50 MCG/DOSE AEPB INHALE ONE DOSE BY MOUTH EVERY 12 HOURS 60 each 6   No current  facility-administered medications on file prior to visit.     BP (!) 142/78   Pulse 75   Temp 98 F (36.7 C) (Oral)   Ht 5\' 4"  (1.626 m)   Wt (!) 346 lb 12.8 oz (157.3 kg)   LMP 09/20/2011   SpO2 98%   BMI 59.53 kg/m    Objective:   Physical Exam  Constitutional: She appears well-nourished.  Neck: Neck supple.  Cardiovascular: Normal rate and regular rhythm.   Pulmonary/Chest: Effort normal and breath sounds normal. She has no wheezes.  Skin: Skin is warm and dry.          Assessment & Plan:

## 2017-02-16 NOTE — Patient Instructions (Addendum)
We've increased your dose of Amlodipine from 5 mg to 10 mg. You may take two of the 5 mg tablets until your current bottle is empty.  Continue to monitor your blood pressure and we will call you for readings in 2 weeks.  Start using your Advair once to twice daily, everyday.   Use the albuterol inhaler only as needed for wheezing/shortness of breath.  Start fexofenadine (Allegra) once daily for allergies.  Start exercising. You should be getting 150 minutes of moderate intensity exercise weekly.  It's important to improve your diet by reducing consumption of fast food, fried food, processed snack foods, sugary drinks. Increase consumption of fresh vegetables and fruits, whole grains, water.  Ensure you are drinking 64 ounces of water daily.  Schedule a lab only appointment after August 21st to recheck your blood sugar levels.  It was a pleasure to see you today!   Prediabetes Prediabetes is the condition of having a blood sugar (blood glucose) level that is higher than it should be, but not high enough for you to be diagnosed with type 2 diabetes. Having prediabetes puts you at risk for developing type 2 diabetes (type 2 diabetes mellitus). Prediabetes may be called impaired glucose tolerance or impaired fasting glucose. Prediabetes usually does not cause symptoms. Your health care provider can diagnose this condition with blood tests. You may be tested for prediabetes if you are overweight and if you have at least one other risk factor for prediabetes. Risk factors for prediabetes include:  Having a family member with type 2 diabetes.  Being overweight or obese.  Being older than age 34.  Being of American-Indian, African-American, Hispanic/Latino, or Asian/Pacific Islander descent.  Having an inactive (sedentary) lifestyle.  Having a history of gestational diabetes or polycystic ovarian syndrome (PCOS).  Having low levels of good cholesterol (HDL-C) or high levels of blood  fats (triglycerides).  Having high blood pressure.  What is blood glucose and how is blood glucose measured?  Blood glucose refers to the amount of glucose in your bloodstream. Glucose comes from eating foods that contain sugars and starches (carbohydrates) that the body breaks down into glucose. Your blood glucose level may be measured in mg/dL (milligrams per deciliter) or mmol/L (millimoles per liter).Your blood glucose may be checked with one or more of the following blood tests:  A fasting blood glucose (FBG) test. You will not be allowed to eat (you will fast) for at least 8 hours before a blood sample is taken. ? A normal range for FBG is 70-100 mg/dl (3.9-5.6 mmol/L).  An A1c (hemoglobin A1c) blood test. This test provides information about blood glucose control over the previous 2?23months.  An oral glucose tolerance test (OGTT). This test measures your blood glucose twice: ? After fasting. This is your baseline level. ? Two hours after you drink a beverage that contains glucose.  You may be diagnosed with prediabetes:  If your FBG is 100?125 mg/dL (5.6-6.9 mmol/L).  If your A1c level is 5.7?6.4%.  If your OGGT result is 140?199 mg/dL (7.8-11 mmol/L).  These blood tests may be repeated to confirm your diagnosis. What happens if blood glucose is too high? The pancreas produces a hormone (insulin) that helps move glucose from the bloodstream into cells. When cells in the body do not respond properly to insulin that the body makes (insulin resistance), excess glucose builds up in the blood instead of going into cells. As a result, high blood glucose (hyperglycemia) can develop, which can cause many complications.  This is a symptom of prediabetes. What can happen if blood glucose stays higher than normal for a long time? Having high blood glucose for a long time is dangerous. Too much glucose in your blood can damage your nerves and blood vessels. Long-term damage can lead to  complications from diabetes, which may include:  Heart disease.  Stroke.  Blindness.  Kidney disease.  Depression.  Poor circulation in the feet and legs, which could lead to surgical removal (amputation) in severe cases.  How can prediabetes be prevented from turning into type 2 diabetes?  To help prevent type 2 diabetes, take the following actions:  Be physically active. ? Do moderate-intensity physical activity for at least 30 minutes on at least 5 days of the week, or as much as told by your health care provider. This could be brisk walking, biking, or water aerobics. ? Ask your health care provider what activities are safe for you. A mix of physical activities may be best, such as walking, swimming, cycling, and strength training.  Lose weight as told by your health care provider. ? Losing 5-7% of your body weight can reverse insulin resistance. ? Your health care provider can determine how much weight loss is best for you and can help you lose weight safely.  Follow a healthy meal plan. This includes eating lean proteins, complex carbohydrates, fresh fruits and vegetables, low-fat dairy products, and healthy fats. ? Follow instructions from your health care provider about eating or drinking restrictions. ? Make an appointment to see a diet and nutrition specialist (registered dietitian) to help you create a healthy eating plan that is right for you.  Do not smoke or use any tobacco products, such as cigarettes, chewing tobacco, and e-cigarettes. If you need help quitting, ask your health care provider.  Take over-the-counter and prescription medicines as told by your health care provider. You may be prescribed medicines that help lower the risk of type 2 diabetes.  This information is not intended to replace advice given to you by your health care provider. Make sure you discuss any questions you have with your health care provider. Document Released: 11/12/2015 Document  Revised: 12/27/2015 Document Reviewed: 09/11/2015 Elsevier Interactive Patient Education  [employer].

## 2017-02-16 NOTE — Assessment & Plan Note (Signed)
A1C of 6.4 in February 2018, reduced to 6.2 in May 2018. Check A1C in late August to ensure reduction. Discussed the importance of a healthy diet and regular exercise in order for weight loss, and to reduce the risk of other medical problems.

## 2017-02-16 NOTE — Assessment & Plan Note (Signed)
Improved but still above goal. Increase Amlodipine to 10 mg once daily. Will have her continue to monitor BP daily, will call for readings in 2 weeks.

## 2017-03-02 ENCOUNTER — Telehealth: Payer: Self-pay | Admitting: Primary Care

## 2017-03-02 NOTE — Telephone Encounter (Signed)
-----   Message from [name], NP sent at 02/16/2017  4:26 PM EDT ----- Regarding: BP Please check on BP since we increased her Amlodipine to 10 mg

## 2017-03-02 NOTE — Telephone Encounter (Signed)
Message left for patient to return my call.  

## 2017-03-05 NOTE — Telephone Encounter (Signed)
Patient states her BP is about the same, 130's to 140's.

## 2017-03-06 NOTE — Telephone Encounter (Signed)
Scheduled

## 2017-03-06 NOTE — Telephone Encounter (Signed)
Noted. Please have her continue to monitor and follow up in 1 month for BP check. Please cancel her appointment for today. Thanks.

## 2017-03-06 NOTE — Telephone Encounter (Signed)
Left message to return call 

## 2017-03-25 ENCOUNTER — Other Ambulatory Visit (INDEPENDENT_AMBULATORY_CARE_PROVIDER_SITE_OTHER): Payer: Managed Care, Other (non HMO)

## 2017-03-25 ENCOUNTER — Telehealth: Payer: Self-pay | Admitting: Primary Care

## 2017-03-25 ENCOUNTER — Other Ambulatory Visit: Payer: Managed Care, Other (non HMO)

## 2017-03-25 DIAGNOSIS — R7303 Prediabetes: Secondary | ICD-10-CM | POA: Diagnosis not present

## 2017-03-25 NOTE — Telephone Encounter (Signed)
Spoken to Raquel Sarna and this was addressed already

## 2017-03-25 NOTE — Telephone Encounter (Signed)
Pt came in for labs but appointment was cancelled. She's unsure of why and wants labs done today.

## 2017-03-26 LAB — HEMOGLOBIN A1C: HEMOGLOBIN A1C: 6.2 % (ref 4.6–6.5)

## 2017-03-27 ENCOUNTER — Telehealth: Payer: Self-pay | Admitting: Primary Care

## 2017-03-27 NOTE — Telephone Encounter (Signed)
Pt returned chan's call Best number [phone]

## 2017-03-27 NOTE — Telephone Encounter (Signed)
LM returning patient's call.  Request she call us back on Monday.

## 2017-04-24 ENCOUNTER — Encounter: Payer: Self-pay | Admitting: Primary Care

## 2017-04-24 ENCOUNTER — Ambulatory Visit (INDEPENDENT_AMBULATORY_CARE_PROVIDER_SITE_OTHER): Payer: Managed Care, Other (non HMO) | Admitting: Primary Care

## 2017-04-24 DIAGNOSIS — I10 Essential (primary) hypertension: Secondary | ICD-10-CM

## 2017-04-24 NOTE — Progress Notes (Signed)
Subjective:    Patient ID: Stacy Meyer, female    DOB: Jun 20, 1961, 56 y.o.   MRN: [id number]  HPI  Stacy Meyer is a 56 year old female who presents today for follow up of hypertension. She is currently managed on Amlodipine 10 mg which was increased in mid July 2018 due to elevated readings. We called to check on readings in late July for which she endorsed systolic readings of 606'T-016'W.   Her BP in the office today is 120/68. She's checking her BP at home and is getting of 120's-140's/70's with most of her readings being in the mid 130's/70's. She is limiting her pork and red meat, she's not adding salt to her foods, and she's paying more attention to sodium content. She denies chest pain, shortness of breath, headaches.   Review of Systems  Eyes: Negative for visual disturbance.  Respiratory: Negative for shortness of breath.   Cardiovascular: Negative for chest pain.  Neurological: Negative for dizziness and headaches.       Past Medical History:  Diagnosis Date  . Anemia 02/2009  . Asthma   . Blood transfusion 2011   [place]  . Morbid obesity ([place])      Social History   Social History  . Marital status: Single    Spouse name: N/A  . Number of children: N/A  . Years of education: N/A   Occupational History  . Not on file.   Social History Main Topics  . Smoking status: Never Smoker  . Smokeless tobacco: Never Used  . Alcohol use Yes     Comment: rarely  . Drug use: No  . Sexual activity: No   Other Topics Concern  . Not on file   Social History Narrative   Single.   2 children, 1 grandchildren.   Works in [employer], Network engineer job.   Enjoys relaxing.    Past Surgical History:  Procedure Laterality Date  . CARPAL TUNNEL RELEASE    . COLONOSCOPY WITH PROPOFOL N/A 10/10/2016   Procedure: COLONOSCOPY WITH PROPOFOL;  Surgeon: Jonathon Bellows, MD;  Location: [facility];  Service: Endoscopy;  Laterality: N/A;  . EXCISION OF GANGLION CYST    . TUBAL  LIGATION  1988  . VAGINAL HYSTERECTOMY  12/01/2011   Procedure: HYSTERECTOMY VAGINAL;  Surgeon: Terrance Mass, MD;  Location: [facility];  Service: Gynecology;  Laterality: Right;    Family History  Problem Relation Age of Onset  . Breast cancer Mother   . Diabetes Father   . Hypertension Father   . Heart disease Father   . Ovarian cancer Sister   . Cancer Brother        Oral  . Colon cancer Brother     No Known Allergies  Current Outpatient Prescriptions on File Prior to Visit  Medication Sig Dispense Refill  . albuterol (PROAIR HFA) 108 (90 Base) MCG/ACT inhaler Inhale 2 puffs into the lungs every 6 (six) hours as needed. For wheezing. 1 Inhaler 6  . amLODipine (NORVASC) 10 MG tablet Take 1 tablet (10 mg total) by mouth daily. 90 tablet 0  . Fluticasone-Salmeterol (ADVAIR DISKUS) 100-50 MCG/DOSE AEPB INHALE ONE DOSE BY MOUTH EVERY 12 HOURS 60 each 6   No current facility-administered medications on file prior to visit.     BP 120/68   Pulse 67   Temp 98 F (36.7 C) (Oral)   Ht 5\' 4"  (1.626 m)   Wt (!) 349 lb 12.8 oz (158.7 kg)  LMP 09/20/2011   SpO2 98%   BMI 60.04 kg/m    Objective:   Physical Exam  Constitutional: She appears well-nourished.  Neck: Neck supple.  Cardiovascular: Normal rate and regular rhythm.   Pulmonary/Chest: Effort normal and breath sounds normal.  Skin: Skin is warm and dry.          Assessment & Plan:

## 2017-04-24 NOTE — Assessment & Plan Note (Signed)
Improved in the office today, home readings stable. Will have her continue to monitor at home, work on weight loss, continue to monitor salt content in food. Follow up in 3 months for CPE.

## 2017-04-24 NOTE — Patient Instructions (Signed)
Continue Amlodipine 10 mg tablets for high blood pressure. Continue to limit salt in your diet.   Start exercising. You should be getting 150 minutes of moderate intensity exercise weekly.  Continue to monitor your blood pressure, it should run less than 135/90.  Please schedule a physical with me in 3 months. You may also schedule a lab only appointment 3-4 days prior. We will discuss your lab results in detail during your physical.  It was a pleasure to see you today!   DASH Eating Plan DASH stands for "Dietary Approaches to Stop Hypertension." The DASH eating plan is a healthy eating plan that has been shown to reduce high blood pressure (hypertension). It may also reduce your risk for type 2 diabetes, heart disease, and stroke. The DASH eating plan may also help with weight loss. What are tips for following this plan? General guidelines  Avoid eating more than 2,300 mg (milligrams) of salt (sodium) a day. If you have hypertension, you may need to reduce your sodium intake to 1,500 mg a day.  Limit alcohol intake to no more than 1 drink a day for nonpregnant women and 2 drinks a day for men. One drink equals 12 oz of beer, 5 oz of wine, or 1 oz of hard liquor.  Work with your health care provider to maintain a healthy body weight or to lose weight. Ask what an ideal weight is for you.  Get at least 30 minutes of exercise that causes your heart to beat faster (aerobic exercise) most days of the week. Activities may include walking, swimming, or biking.  Work with your health care provider or diet and nutrition specialist (dietitian) to adjust your eating plan to your individual calorie needs. Reading food labels  Check food labels for the amount of sodium per serving. Choose foods with less than 5 percent of the Daily Value of sodium. Generally, foods with less than 300 mg of sodium per serving fit into this eating plan.  To find whole grains, look for the word "whole" as the first  word in the ingredient list. Shopping  Buy products labeled as "low-sodium" or "no salt added."  Buy fresh foods. Avoid canned foods and premade or frozen meals. Cooking  Avoid adding salt when cooking. Use salt-free seasonings or herbs instead of table salt or sea salt. Check with your health care provider or pharmacist before using salt substitutes.  Do not fry foods. Cook foods using healthy methods such as baking, boiling, grilling, and broiling instead.  Cook with heart-healthy oils, such as olive, canola, soybean, or sunflower oil. Meal planning   Eat a balanced diet that includes: ? 5 or more servings of fruits and vegetables each day. At each meal, try to fill half of your plate with fruits and vegetables. ? Up to 6-8 servings of whole grains each day. ? Less than 6 oz of lean meat, poultry, or fish each day. A 3-oz serving of meat is about the same size as a deck of cards. One egg equals 1 oz. ? 2 servings of low-fat dairy each day. ? A serving of nuts, seeds, or beans 5 times each week. ? Heart-healthy fats. Healthy fats called Omega-3 fatty acids are found in foods such as flaxseeds and coldwater fish, like sardines, salmon, and mackerel.  Limit how much you eat of the following: ? Canned or prepackaged foods. ? Food that is high in trans fat, such as fried foods. ? Food that is high in saturated fat, such  as fatty meat. ? Sweets, desserts, sugary drinks, and other foods with added sugar. ? Full-fat dairy products.  Do not salt foods before eating.  Try to eat at least 2 vegetarian meals each week.  Eat more home-cooked food and less restaurant, buffet, and fast food.  When eating at a restaurant, ask that your food be prepared with less salt or no salt, if possible. What foods are recommended? The items listed may not be a complete list. Talk with your dietitian about what dietary choices are best for you. Grains Whole-grain or whole-wheat bread. Whole-grain or  whole-wheat pasta. Brown rice. [place]. Bulgur. Whole-grain and low-sodium cereals. Pita bread. Low-fat, low-sodium crackers. Whole-wheat flour tortillas. Vegetables Fresh or frozen vegetables (raw, steamed, roasted, or grilled). Low-sodium or reduced-sodium tomato and vegetable juice. Low-sodium or reduced-sodium tomato sauce and tomato paste. Low-sodium or reduced-sodium canned vegetables. Fruits All fresh, dried, or frozen fruit. Canned fruit in natural juice (without added sugar). Meat and other protein foods Skinless chicken or [place]. Ground chicken or [place]. Pork with fat trimmed off. Fish and seafood. Egg whites. Dried beans, peas, or lentils. Unsalted nuts, nut butters, and seeds. Unsalted canned beans. Lean cuts of beef with fat trimmed off. Low-sodium, lean deli meat. Dairy Low-fat (1%) or fat-free (skim) milk. Fat-free, low-fat, or reduced-fat cheeses. Nonfat, low-sodium ricotta or cottage cheese. Low-fat or nonfat yogurt. Low-fat, low-sodium cheese. Fats and oils Soft margarine without trans fats. Vegetable oil. Low-fat, reduced-fat, or light mayonnaise and salad dressings (reduced-sodium). Canola, safflower, olive, soybean, and sunflower oils. Avocado. Seasoning and other foods Herbs. Spices. Seasoning mixes without salt. Unsalted popcorn and pretzels. Fat-free sweets. What foods are not recommended? The items listed may not be a complete list. Talk with your dietitian about what dietary choices are best for you. Grains Baked goods made with fat, such as croissants, muffins, or some breads. Dry pasta or rice meal packs. Vegetables Creamed or fried vegetables. Vegetables in a cheese sauce. Regular canned vegetables (not low-sodium or reduced-sodium). Regular canned tomato sauce and paste (not low-sodium or reduced-sodium). Regular tomato and vegetable juice (not low-sodium or reduced-sodium). Angie Fava. Olives. Fruits Canned fruit in a light or heavy syrup. Fried fruit. Fruit  in cream or butter sauce. Meat and other protein foods Fatty cuts of meat. Ribs. Fried meat. Berniece Salines. Sausage. Bologna and other processed lunch meats. Salami. Fatback. Hotdogs. Bratwurst. Salted nuts and seeds. Canned beans with added salt. Canned or smoked fish. Whole eggs or egg yolks. Chicken or [place] with skin. Dairy Whole or 2% milk, cream, and half-and-half. Whole or full-fat cream cheese. Whole-fat or sweetened yogurt. Full-fat cheese. Nondairy creamers. Whipped toppings. Processed cheese and cheese spreads. Fats and oils Butter. Stick margarine. Lard. Shortening. Ghee. Bacon fat. Tropical oils, such as coconut, palm kernel, or palm oil. Seasoning and other foods Salted popcorn and pretzels. Onion salt, garlic salt, seasoned salt, table salt, and sea salt. Worcestershire sauce. Tartar sauce. Barbecue sauce. Teriyaki sauce. Soy sauce, including reduced-sodium. Steak sauce. Canned and packaged gravies. Fish sauce. Oyster sauce. Cocktail sauce. Horseradish that you find on the shelf. Ketchup. Mustard. Meat flavorings and tenderizers. Bouillon cubes. Hot sauce and Tabasco sauce. Premade or packaged marinades. Premade or packaged taco seasonings. Relishes. Regular salad dressings. Where to find more information:  National Heart, Lung, and [place]: https://wilson-eaton.com/  American Heart Association: www.heart.org Summary  The DASH eating plan is a healthy eating plan that has been shown to reduce high blood pressure (hypertension). It may also reduce your risk  for type 2 diabetes, heart disease, and stroke.  With the DASH eating plan, you should limit salt (sodium) intake to 2,300 mg a day. If you have hypertension, you may need to reduce your sodium intake to 1,500 mg a day.  When on the DASH eating plan, aim to eat more fresh fruits and vegetables, whole grains, lean proteins, low-fat dairy, and heart-healthy fats.  Work with your health care provider or diet and nutrition specialist  (dietitian) to adjust your eating plan to your individual calorie needs. This information is not intended to replace advice given to you by your health care provider. Make sure you discuss any questions you have with your health care provider. Document Released: 07/10/2011 Document Revised: 07/14/2016 Document Reviewed: 07/14/2016 Elsevier Interactive Patient Education  2017 [employer].

## 2017-05-17 ENCOUNTER — Other Ambulatory Visit: Payer: Self-pay | Admitting: Primary Care

## 2017-05-17 DIAGNOSIS — I10 Essential (primary) hypertension: Secondary | ICD-10-CM

## 2017-06-29 ENCOUNTER — Other Ambulatory Visit: Payer: Self-pay | Admitting: Primary Care

## 2017-06-29 DIAGNOSIS — R7303 Prediabetes: Secondary | ICD-10-CM

## 2017-06-29 DIAGNOSIS — I10 Essential (primary) hypertension: Secondary | ICD-10-CM

## 2017-06-30 ENCOUNTER — Other Ambulatory Visit (INDEPENDENT_AMBULATORY_CARE_PROVIDER_SITE_OTHER): Payer: Managed Care, Other (non HMO)

## 2017-06-30 DIAGNOSIS — I10 Essential (primary) hypertension: Secondary | ICD-10-CM | POA: Diagnosis not present

## 2017-06-30 DIAGNOSIS — R7303 Prediabetes: Secondary | ICD-10-CM | POA: Diagnosis not present

## 2017-06-30 LAB — COMPREHENSIVE METABOLIC PANEL
ALBUMIN: 3.9 g/dL (ref 3.5–5.2)
ALK PHOS: 66 U/L (ref 39–117)
ALT: 6 U/L (ref 0–35)
AST: 15 U/L (ref 0–37)
BILIRUBIN TOTAL: 0.6 mg/dL (ref 0.2–1.2)
BUN: 17 mg/dL (ref 6–23)
CALCIUM: 9.5 mg/dL (ref 8.4–10.5)
CO2: 31 mEq/L (ref 19–32)
Chloride: 105 mEq/L (ref 96–112)
Creatinine, Ser: 0.94 mg/dL (ref 0.40–1.20)
GFR: 79.75 mL/min (ref >60.00)
GLUCOSE: 107 mg/dL — AB (ref 70–99)
POTASSIUM: 4.1 meq/L (ref 3.5–5.1)
Sodium: 142 mEq/L (ref 135–145)
TOTAL PROTEIN: 7.5 g/dL (ref 6.0–8.3)

## 2017-06-30 LAB — LIPID PANEL
CHOLESTEROL: 213 mg/dL — AB (ref 0–200)
HDL: 62.3 mg/dL (ref >39.00)
LDL Cholesterol: 140 mg/dL — ABNORMAL HIGH (ref 0–99)
NONHDL: 150.34
TRIGLYCERIDES: 53 mg/dL (ref 0.0–149.0)
Total CHOL/HDL Ratio: 3
VLDL: 10.6 mg/dL (ref 0.0–40.0)

## 2017-06-30 LAB — HEMOGLOBIN A1C: Hgb A1c MFr Bld: 6.3 % (ref 4.6–6.5)

## 2017-07-03 ENCOUNTER — Encounter: Payer: Self-pay | Admitting: Primary Care

## 2017-07-03 ENCOUNTER — Ambulatory Visit (INDEPENDENT_AMBULATORY_CARE_PROVIDER_SITE_OTHER): Payer: Managed Care, Other (non HMO) | Admitting: Primary Care

## 2017-07-03 VITALS — BP 118/70 | HR 98 | Temp 98.2°F | Ht 64.0 in | Wt 344.0 lb

## 2017-07-03 DIAGNOSIS — Z23 Encounter for immunization: Secondary | ICD-10-CM | POA: Diagnosis not present

## 2017-07-03 DIAGNOSIS — I10 Essential (primary) hypertension: Secondary | ICD-10-CM

## 2017-07-03 DIAGNOSIS — J4520 Mild intermittent asthma, uncomplicated: Secondary | ICD-10-CM | POA: Diagnosis not present

## 2017-07-03 DIAGNOSIS — R7303 Prediabetes: Secondary | ICD-10-CM

## 2017-07-03 DIAGNOSIS — K219 Gastro-esophageal reflux disease without esophagitis: Secondary | ICD-10-CM | POA: Insufficient documentation

## 2017-07-03 DIAGNOSIS — E785 Hyperlipidemia, unspecified: Secondary | ICD-10-CM | POA: Diagnosis not present

## 2017-07-03 DIAGNOSIS — Z0000 Encounter for general adult medical examination without abnormal findings: Secondary | ICD-10-CM | POA: Diagnosis not present

## 2017-07-03 NOTE — Assessment & Plan Note (Signed)
Abdominal symptoms represent GERD. Will have her work on diet and try Zantac 150 mg once to twice daily. She will update.

## 2017-07-03 NOTE — Assessment & Plan Note (Signed)
No recent use of inhalers. Overall doing well.

## 2017-07-03 NOTE — Assessment & Plan Note (Signed)
Recent A1C of 6.3 on labs which is about the same most of the year. Strongly encouraged improvement in diet and regular exercise. Will continue to monitor, repeat in 6 months.

## 2017-07-03 NOTE — Assessment & Plan Note (Signed)
Prediabetic, also with hyperlipidemia. Strongly recommended improved diet and regular exercise.

## 2017-07-03 NOTE — Patient Instructions (Addendum)
Start exercising. You should be getting 150 minutes of moderate intensity exercise weekly.  It's important to improve your diet by reducing consumption of fast food, fried food, processed snack foods, sugary drinks. Increase consumption of fresh vegetables and fruits, whole grains, water.  Ensure you are drinking 64 ounces of water daily.  Start ranitidine (Zantac) 150 mg tablets for stomach symptoms. Try this for at least one month. Please update me if no improvement.  Schedule a lab only appointment in 6 months to recheck your cholesterol and blood sugar levels. Make sure you've not had anything to eat 8 hours before this lab.  Follow up in 1 year for your annual exam or sooner if needed.  It was a pleasure to see you today!

## 2017-07-03 NOTE — Assessment & Plan Note (Signed)
TC and LDL above goal. Will have her work on weight loss through diet and exercise. Repeat labs in 6 months.

## 2017-07-03 NOTE — Progress Notes (Signed)
Subjective:    Patient ID: Stacy Meyer, female    DOB: 1961-03-27, 56 y.o.   MRN: [id number]  HPI  Stacy Meyer is a 56 year old female who presents today for complete physical.  Immunizations: -Tetanus: Due.  -Influenza: Declines   Diet: She endorses a poor diet. Breakfast: Skips, pop tart, fast food Lunch: Pasta, chicken, sweet potatoes, greens Dinner: Skips, meat, greens, corn Snacks: Ginger snaps, butter cookies Desserts: twice weekly Beverages: Water, soda, occasionally sweet tea  Exercise: She does not currently exercise. She's been walking around the hospital recently.  Eye exam: Due. Dental exam:  Due. Colonoscopy: Completed in 2018, due to follow up in December.  Pap Smear: Hysterectomy  Mammogram: Completed in May 2018   Review of Systems  Constitutional: Negative for unexpected weight change.  HENT: Negative for rhinorrhea.   Respiratory: Negative for cough and shortness of breath.   Cardiovascular: Negative for chest pain.  Gastrointestinal: Negative for constipation and diarrhea.       Intermittent epigastric pain with gastric reflux and esophageal burning after meals.   Genitourinary: Negative for difficulty urinating and menstrual problem.  Musculoskeletal: Negative for arthralgias and myalgias.  Skin: Negative for rash.  Allergic/Immunologic: Negative for environmental allergies.  Neurological: Negative for dizziness, numbness and headaches.  Psychiatric/Behavioral:       Denies concerns for anxiety and depression       Past Medical History:  Diagnosis Date  . Anemia 02/2009  . Asthma   . Blood transfusion 2011   [place]  . Morbid obesity ([place])      Social History   Socioeconomic History  . Marital status: Single    Spouse name: Not on file  . Number of children: Not on file  . Years of education: Not on file  . Highest education level: Not on file  Social Needs  . Financial resource strain: Not on file  . Food insecurity -  worry: Not on file  . Food insecurity - inability: Not on file  . Transportation needs - medical: Not on file  . Transportation needs - non-medical: Not on file  Occupational History  . Not on file  Tobacco Use  . Smoking status: Never Smoker  . Smokeless tobacco: Never Used  Substance and Sexual Activity  . Alcohol use: Yes    Comment: rarely  . Drug use: No  . Sexual activity: No    Birth control/protection: Pill  Other Topics Concern  . Not on file  Social History Narrative   Single.   2 children, 1 grandchildren.   Works in [employer], Network engineer job.   Enjoys relaxing.    Past Surgical History:  Procedure Laterality Date  . CARPAL TUNNEL RELEASE    . COLONOSCOPY WITH PROPOFOL N/A 10/10/2016   Procedure: COLONOSCOPY WITH PROPOFOL;  Surgeon: Jonathon Bellows, MD;  Location: [facility];  Service: Endoscopy;  Laterality: N/A;  . EXCISION OF GANGLION CYST    . TUBAL LIGATION  1988  . VAGINAL HYSTERECTOMY  12/01/2011   Procedure: HYSTERECTOMY VAGINAL;  Surgeon: Terrance Mass, MD;  Location: [facility];  Service: Gynecology;  Laterality: Right;    Family History  Problem Relation Age of Onset  . Breast cancer Mother   . Diabetes Father   . Hypertension Father   . Heart disease Father   . Ovarian cancer Sister   . Cancer Brother        Oral  . Colon cancer Brother     No  Known Allergies  Current Outpatient Medications on File Prior to Visit  Medication Sig Dispense Refill  . albuterol (PROAIR HFA) 108 (90 Base) MCG/ACT inhaler Inhale 2 puffs into the lungs every 6 (six) hours as needed. For wheezing. 1 Inhaler 6  . amLODipine (NORVASC) 10 MG tablet TAKE 1 TABLET BY MOUTH EVERY DAY 90 tablet 0  . Fluticasone-Salmeterol (ADVAIR DISKUS) 100-50 MCG/DOSE AEPB INHALE ONE DOSE BY MOUTH EVERY 12 HOURS 60 each 6   No current facility-administered medications on file prior to visit.     BP 118/70   Pulse 98   Temp 98.2 F (36.8 C) (Oral)   Ht 5\' 4"  (1.626 m)   Wt (!) 344 lb (156  kg)   LMP 09/20/2011   SpO2 99%   BMI 59.05 kg/m    Objective:   Physical Exam  Constitutional: She is oriented to person, place, and time. She appears well-nourished.  HENT:  Right Ear: Tympanic membrane and ear canal normal.  Left Ear: Tympanic membrane and ear canal normal.  Nose: Nose normal.  Mouth/Throat: Oropharynx is clear and moist.  Eyes: Conjunctivae and EOM are normal. Pupils are equal, round, and reactive to light.  Neck: Neck supple. No thyromegaly present.  Cardiovascular: Normal rate and regular rhythm.  No murmur heard. Pulmonary/Chest: Effort normal and breath sounds normal. She has no rales.  Abdominal: Soft. Bowel sounds are normal. There is no tenderness.  Musculoskeletal: Normal range of motion.  Lymphadenopathy:    She has no cervical adenopathy.  Neurological: She is alert and oriented to person, place, and time. She has normal reflexes. No cranial nerve deficit.  Skin: Skin is warm and dry. No rash noted.  Psychiatric: She has a normal mood and affect.          Assessment & Plan:

## 2017-07-03 NOTE — Assessment & Plan Note (Signed)
Stable in the office today, continue amlodipine 10 mg.  BMP unremarkable.

## 2017-07-03 NOTE — Addendum Note (Signed)
Addended by: Jacqualin Combes on: 07/03/2017 03:44 PM   Modules accepted: Orders

## 2017-07-03 NOTE — Assessment & Plan Note (Signed)
Td due, provided today. Declines influenza.  Mammogram UTD. Colonoscopy UTD. Discussed the importance of a healthy diet and regular exercise in order for weight loss, and to reduce the risk of other medical problems. Exam unremarkable.  Labs with prediabetes and hyperlipidemia. Follow up in 1 year.

## 2017-08-19 ENCOUNTER — Ambulatory Visit: Payer: Managed Care, Other (non HMO) | Admitting: Primary Care

## 2017-08-19 ENCOUNTER — Encounter (INDEPENDENT_AMBULATORY_CARE_PROVIDER_SITE_OTHER): Payer: Self-pay

## 2017-08-19 VITALS — BP 140/86 | HR 73 | Temp 98.5°F | Ht 64.0 in | Wt 348.0 lb

## 2017-08-19 DIAGNOSIS — R221 Localized swelling, mass and lump, neck: Secondary | ICD-10-CM | POA: Diagnosis not present

## 2017-08-19 NOTE — Patient Instructions (Signed)
Stop by the front desk and speak with either Rosaria Ferries or Shirlean Mylar regarding your ultrasound.  It was a pleasure to see you today!

## 2017-08-19 NOTE — Progress Notes (Signed)
   Subjective:    Patient ID: Stacy Meyer, female    DOB: 03-31-1961, 57 y.o.   MRN: [id number]  HPI  Stacy Meyer is a 57 year old female with a chief complaint of neck mass.   Her neck mass has been present since November 2018 and is located to the left submandibular region. She denies pain, fevers, unexplained weight loss, color changes, changes in size, chronic cough or respiratory symptoms. She does have seasonal allergies which began several days ago.   Review of Systems  HENT: Negative for sinus pressure and trouble swallowing.   Skin: Negative for color change.       Neck mass  Allergic/Immunologic: Positive for environmental allergies.       Objective:   Physical Exam  Constitutional: She appears well-nourished.  Neck: Neck supple.    1 cm rounded, soft, mobile, non tender mass to left submandibular region. Deep into subcutaneous tissue.  Cardiovascular: Normal rate and regular rhythm.  Pulmonary/Chest: Effort normal and breath sounds normal.  Skin: Skin is warm and dry. No erythema.          Assessment & Plan:  Neck Mass:  Located to left submandibular region since November 2018. Could be enlarged lymph node vs benign cyst/lipoma. Will check with soft tissue ultrasound.  TSH in February 2018 normal, no history of thyroid disorder or cancers.  Sheral Flow, NP

## 2017-08-21 ENCOUNTER — Ambulatory Visit
Admission: RE | Admit: 2017-08-21 | Discharge: 2017-08-21 | Disposition: A | Discharge status: Home / Self Care | Payer: Managed Care, Other (non HMO) | Source: Ambulatory Visit | Attending: Primary Care | Admitting: Primary Care

## 2017-08-21 DIAGNOSIS — R221 Localized swelling, mass and lump, neck: Secondary | ICD-10-CM

## 2017-08-24 ENCOUNTER — Other Ambulatory Visit: Payer: Self-pay | Admitting: Primary Care

## 2017-08-24 DIAGNOSIS — R590 Localized enlarged lymph nodes: Secondary | ICD-10-CM

## 2017-08-26 ENCOUNTER — Other Ambulatory Visit (INDEPENDENT_AMBULATORY_CARE_PROVIDER_SITE_OTHER): Payer: Managed Care, Other (non HMO)

## 2017-08-26 DIAGNOSIS — R221 Localized swelling, mass and lump, neck: Secondary | ICD-10-CM

## 2017-08-26 DIAGNOSIS — R590 Localized enlarged lymph nodes: Secondary | ICD-10-CM

## 2017-08-26 LAB — CBC
HCT: 38.7 % (ref 36.0–46.0)
Hemoglobin: 12.5 g/dL (ref 12.0–15.0)
MCHC: 32.4 g/dL (ref 30.0–36.0)
MCV: 83 fl (ref 78.0–100.0)
PLATELETS: 399 10*3/uL (ref 150.0–400.0)
RBC: 4.66 Mil/uL (ref 3.87–5.11)
RDW: 15.6 % — ABNORMAL HIGH (ref 11.5–15.5)
WBC: 5.2 10*3/uL (ref 4.0–10.5)

## 2017-08-26 LAB — TSH: TSH: 0.71 u[IU]/mL (ref 0.35–4.50)

## 2017-10-07 ENCOUNTER — Telehealth: Payer: Self-pay

## 2017-10-07 DIAGNOSIS — R221 Localized swelling, mass and lump, neck: Secondary | ICD-10-CM

## 2017-10-07 NOTE — Telephone Encounter (Signed)
Copied from [place] [id number]. Topic: Referral - Request >> Oct 07, 2017  4:49 PM Oliver Pila B wrote: Reason for CRM: pt called to tell her pcp to go ahead and send out a referral to a ENT physician

## 2017-10-07 NOTE — Telephone Encounter (Signed)
Noted, please notify patient that referral was placed. 

## 2017-10-08 NOTE — Telephone Encounter (Signed)
Message left for patient to return my call.  

## 2017-10-09 NOTE — Telephone Encounter (Signed)
Spoken and notified patient of Kate's comments. Patient verbalized understanding. 

## 2017-11-04 ENCOUNTER — Other Ambulatory Visit: Payer: Self-pay | Admitting: Unknown Physician Specialty

## 2017-11-05 ENCOUNTER — Other Ambulatory Visit: Payer: Self-pay | Admitting: Unknown Physician Specialty

## 2017-11-05 DIAGNOSIS — R220 Localized swelling, mass and lump, head: Secondary | ICD-10-CM

## 2017-11-10 ENCOUNTER — Other Ambulatory Visit: Payer: Self-pay | Admitting: Physician Assistant

## 2017-11-10 ENCOUNTER — Other Ambulatory Visit: Payer: Self-pay | Admitting: Radiology

## 2017-11-11 ENCOUNTER — Other Ambulatory Visit: Payer: Self-pay | Admitting: Unknown Physician Specialty

## 2017-11-11 ENCOUNTER — Ambulatory Visit
Admission: RE | Admit: 2017-11-11 | Discharge: 2017-11-11 | Disposition: A | Discharge status: Home / Self Care | Payer: Managed Care, Other (non HMO) | Source: Ambulatory Visit | Attending: Unknown Physician Specialty | Admitting: Unknown Physician Specialty

## 2017-11-11 DIAGNOSIS — R220 Localized swelling, mass and lump, head: Secondary | ICD-10-CM

## 2017-11-11 DIAGNOSIS — R221 Localized swelling, mass and lump, neck: Secondary | ICD-10-CM | POA: Insufficient documentation

## 2017-11-11 IMAGING — US US SOFT TISSUE HEAD/NECK
2 series · 13 of 18 positions shown · non-contrast
Comparison: [DATE]

CLINICAL DATA: 56-year-old female referred for possible neck nodule
biopsy

EXAM:
ULTRASOUND OF HEAD/NECK SOFT TISSUES
TECHNIQUE: Ultrasound examination of the head and neck soft tissues was
performed in the area of clinical concern.

[Series 1: us soft tissue head/neck · 16 acquisitions, 12 frames shown (1 of 2)]
[im 1/16]
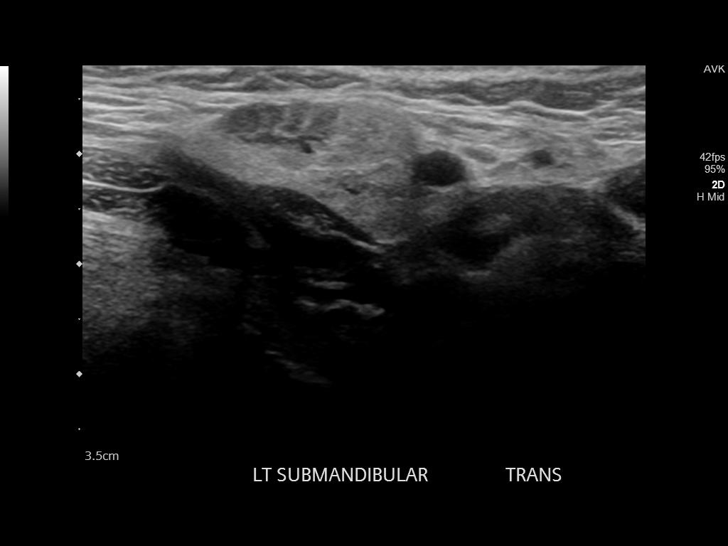
[im 3/16]
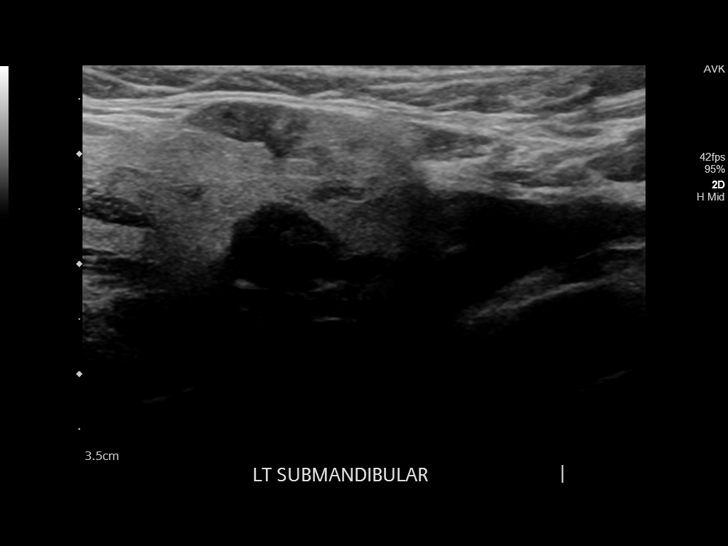
[im 4/16]
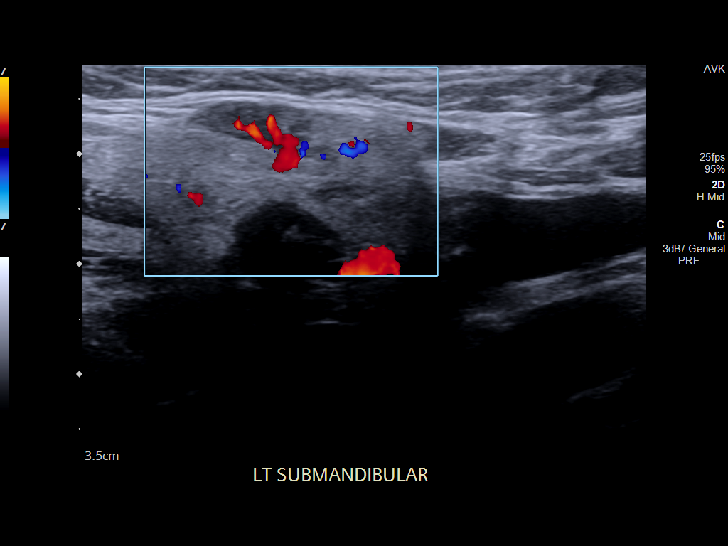
[im 5/16]
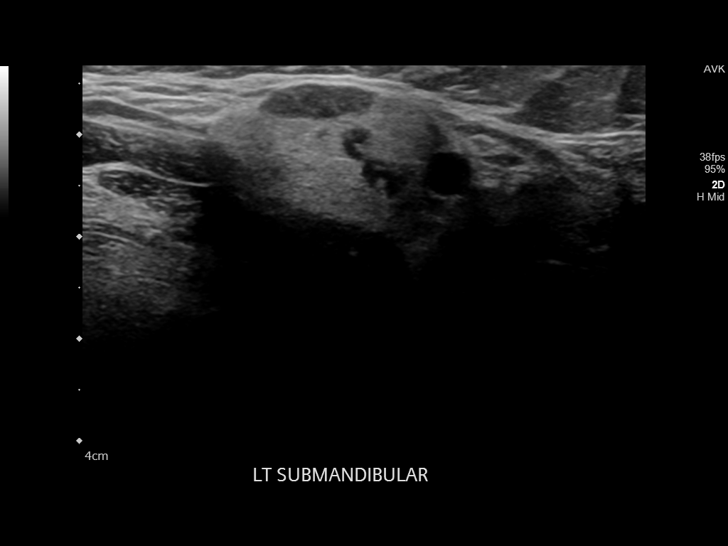
[im 7/16]
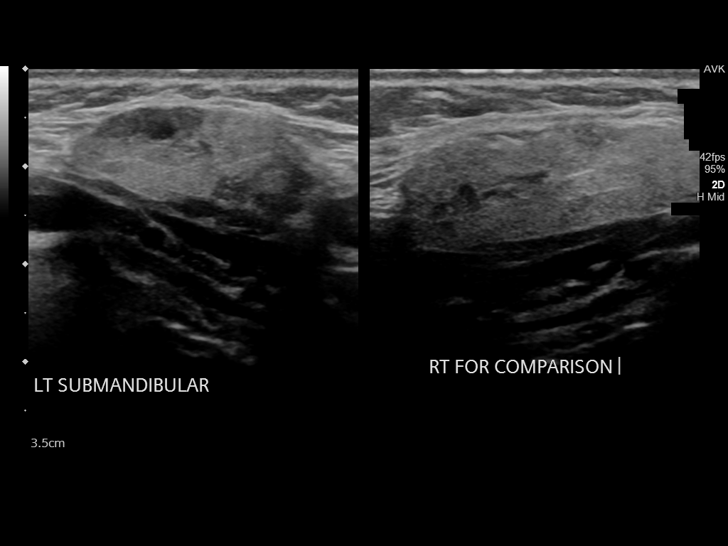
[im 8/16]
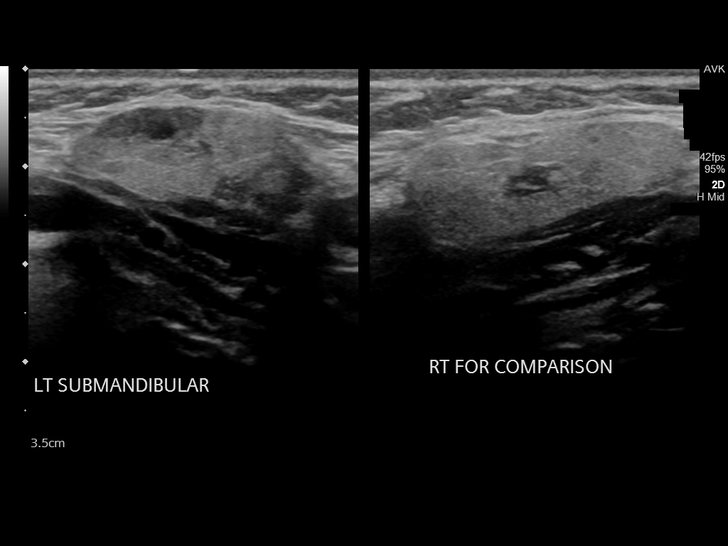
[im 10/16]
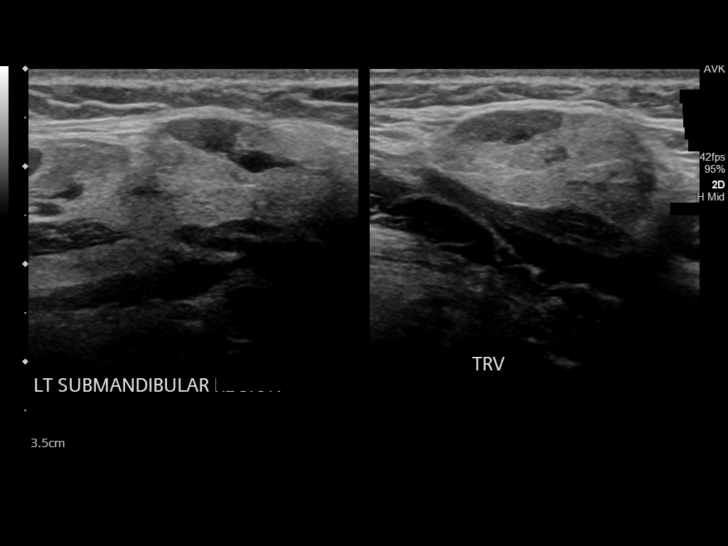
[im 11/16]
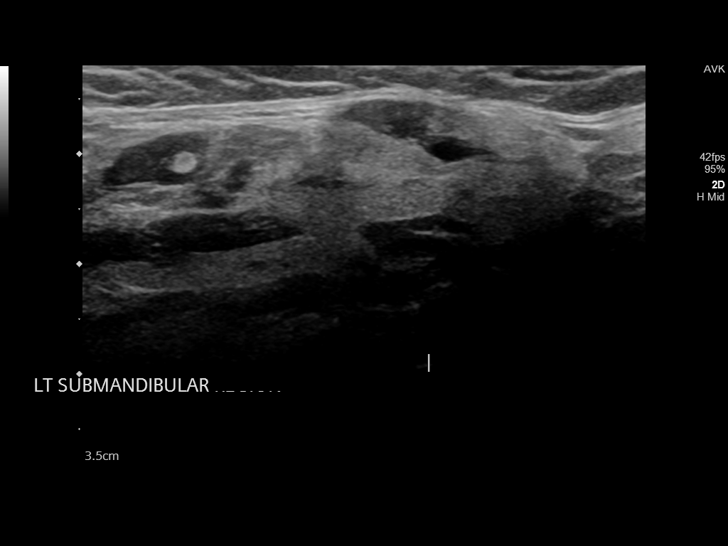
[im 12/16]
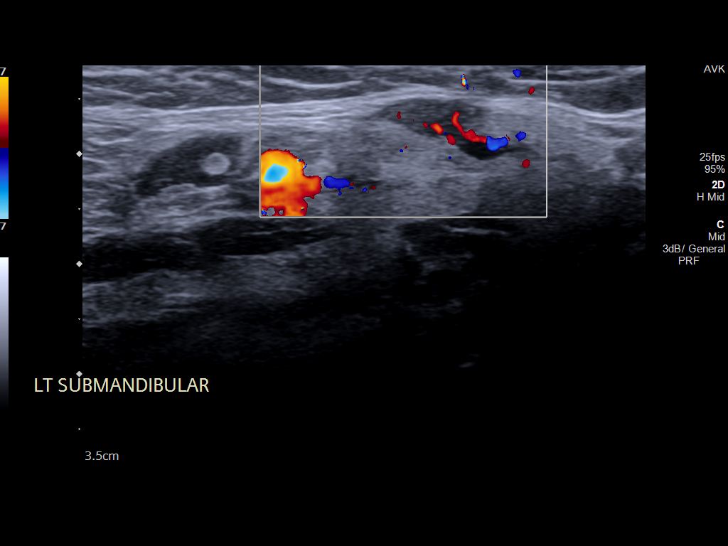
[im 14/16]
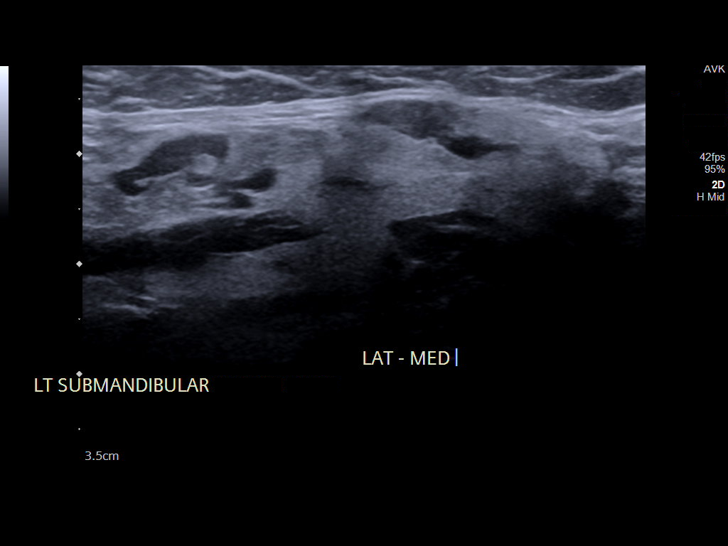
[im 15/16]
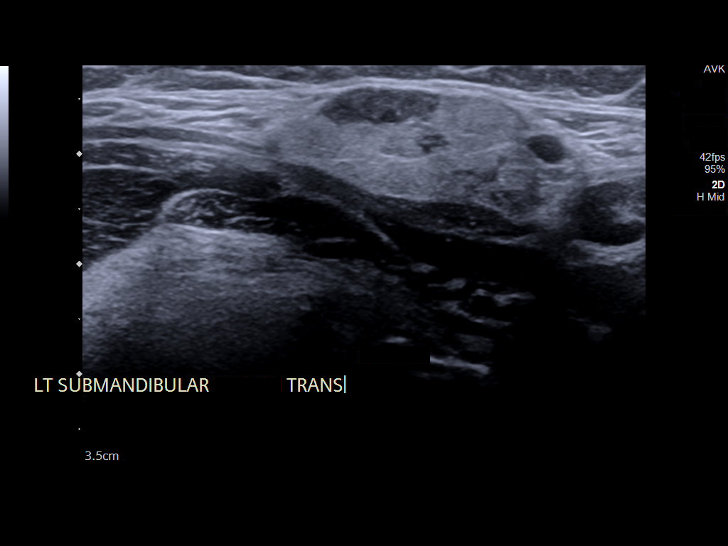
[im 16/16]
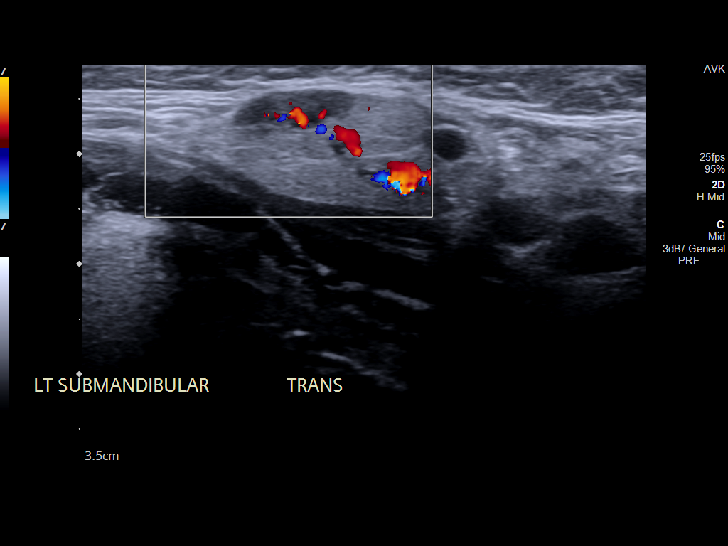

[Series 2: us soft tissue head/neck · 2 acquisitions, 1 frame shown (2 of 2)]
[im 2/2]
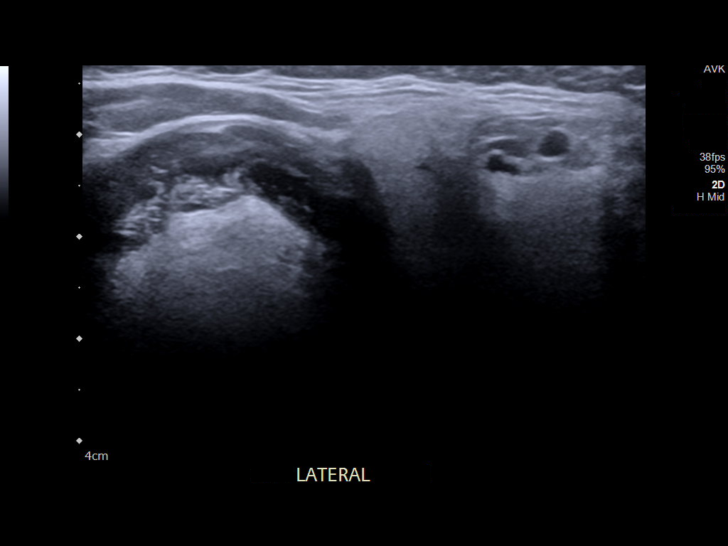

[13 of 18 positions shown; findings below may reference images not displayed]

FINDINGS: Real-time ultrasound was performed for potential percutaneous
biopsy.

Small lymph nodes with typical architecture identified, without
adenopathy.

No focal fluid collection.

No soft tissue mass.

Bilateral submandibular glands were interrogated, somewhat
heterogeneous bilaterally with focal hypoechoic area on the left.
The submandibular gland did not correlate to the area identified by
the patient on physical exam, which was more inferior and lateral.
Specifically in the area identified by the patient, there is no
ultrasound correlate for biopsy.

Findings were discussed with the patient and biopsy was deferred at
this time.
IMPRESSION: Limited sonographic survey of the neck soft tissues for a potential
biopsy demonstrates no biopsy target correlating to the patient's
palpable area on the left neck. Biopsy deferred at this time.

Sonographic survey demonstrates relatively symmetric appearance of
the submandibular glands, which is mildly heterogeneous.

## 2017-11-11 NOTE — Progress Notes (Signed)
Interventional Radiology Procedure Note  Patient is 57 year old female referred for possible US guided neck mass bx.   She tells me she has a palpable nodule on the left neck, near the angle of the mandible, which feels like it has "moved lower over time".    On physical exam, I can feel what she is feeling, with a less than 1 cm rubbery, mobile nodule in the deep tissues of the left neck, about 3-4 cm below the angle of mandible, lateral to the trachea.  No skin changes or asymmetry.    On real time [place], there is no corresponding finding, with no suspicious lymph node, soft tissue nodule, or fluid.  The bilateral submandibular glands are somewhat heterogenous, but without mass or suspicious findings.    I discussed with her the US findings, and that there is no target for a safe percutaneous biopsy.  She understands.    Biopsy was deferred at this time.   Signed,  Dulcy Fanny. Earleen Newport, DO

## 2017-12-21 ENCOUNTER — Other Ambulatory Visit: Payer: Self-pay | Admitting: Primary Care

## 2017-12-21 DIAGNOSIS — E785 Hyperlipidemia, unspecified: Secondary | ICD-10-CM

## 2017-12-21 DIAGNOSIS — R7303 Prediabetes: Secondary | ICD-10-CM

## 2017-12-31 ENCOUNTER — Other Ambulatory Visit (INDEPENDENT_AMBULATORY_CARE_PROVIDER_SITE_OTHER): Payer: Managed Care, Other (non HMO)

## 2017-12-31 DIAGNOSIS — R7303 Prediabetes: Secondary | ICD-10-CM | POA: Diagnosis not present

## 2017-12-31 DIAGNOSIS — E785 Hyperlipidemia, unspecified: Secondary | ICD-10-CM

## 2017-12-31 LAB — LIPID PANEL
CHOLESTEROL: 203 mg/dL — AB (ref 0–200)
HDL: 63.9 mg/dL (ref >39.00)
LDL CALC: 129 mg/dL — AB (ref 0–99)
NonHDL: 139.23
Total CHOL/HDL Ratio: 3
Triglycerides: 51 mg/dL (ref 0.0–149.0)
VLDL: 10.2 mg/dL (ref 0.0–40.0)

## 2017-12-31 LAB — HEMOGLOBIN A1C: HEMOGLOBIN A1C: 6.2 % (ref 4.6–6.5)

## 2018-01-05 ENCOUNTER — Ambulatory Visit: Payer: Managed Care, Other (non HMO) | Admitting: Primary Care

## 2018-01-05 VITALS — BP 140/86 | HR 75 | Temp 98.6°F | Ht 64.0 in | Wt 356.2 lb

## 2018-01-05 DIAGNOSIS — K2100 Gastro-esophageal reflux disease with esophagitis, without bleeding: Secondary | ICD-10-CM

## 2018-01-05 DIAGNOSIS — R1013 Epigastric pain: Secondary | ICD-10-CM

## 2018-01-05 MED ORDER — RANITIDINE HCL 150 MG PO TABS: ORAL_TABLET | ORAL | 0 refills | Status: DC

## 2018-01-05 NOTE — Assessment & Plan Note (Signed)
Present for the last 1 year. HPI suggestive of hiatal hernia vs GERD. No alarm signs, doubt infectious cause.  Will have her trial Zantach 150 mg once to twice daily for the next two weeks. Information regarding GERD triggers discussed.   If no improvement then consider ultrasound to rule out gall bladder involvement and or assess for hiatal hernia.

## 2018-01-05 NOTE — Assessment & Plan Note (Signed)
Infrequent esophageal burning symptoms.  HPI suggestive of hiatal hernia vs GERD. No alarm signs, doubt infectious cause.  Will have her trial Zantach 150 mg once to twice daily for the next two weeks. Information regarding GERD triggers discussed.   If no improvement then consider ultrasound to rule out gall bladder involvement and or assess for hiatal hernia.

## 2018-01-05 NOTE — Progress Notes (Signed)
Subjective:    Patient ID: Stacy Meyer, female    DOB: October 30, 1960, 57 y.o.   MRN: [id number]  HPI  Stacy Meyer is a 57 year old female with a history of GERD, morbid obesity, hypertension, prediabetes, who presents today with a chief complaint of abodminal pain.  Her pain is located to the epigastric region that has been present for the past one year. She describes her pain as a spasm that will be sharp and radiate around to her bilateral abdomen and to her back. Her pain will occur intermittently when eating, rising from a seated position, and sitting up after laying down. Symptoms don't occur every time she eats.  She has noticed early satiety since her hysterectomy in 2014. She hasn't identified any trigger foods for her symptoms. She never has pain upon palpation, just internal pain.  She takes Tums infrequently for esophageal burning. She's denies diarrhea, constipation, rectal bleeding, unexplained weight loss. Some nausea intermittently.  Her bowel movements are 2-3 times daily and are soft, this has been the case since her colonoscopy in 2018. Colonoscopy in December 2018 with evidence of polyps, due again in 2021. No history of endoscopy.    Review of Systems  Respiratory: Negative for shortness of breath.   Cardiovascular: Negative for chest pain.  Gastrointestinal: Negative for blood in stool, constipation, diarrhea and vomiting.       Epigastric pain and nausea, intermittent.        Past Medical History:  Diagnosis Date  . Anemia 02/2009  . Asthma   . Blood transfusion 2011   Stacy Meyer  . Morbid obesity ([place])      Social History   Socioeconomic History  . Marital status: Single    Spouse name: Not on file  . Number of children: Not on file  . Years of education: Not on file  . Highest education level: Not on file  Occupational History  . Not on file  Social Needs  . Financial resource strain: Not on file  . Food insecurity:    Worry: Not on file   Inability: Not on file  . Transportation needs:    Medical: Not on file    Non-medical: Not on file  Tobacco Use  . Smoking status: Never Smoker  . Smokeless tobacco: Never Used  Substance and Sexual Activity  . Alcohol use: Yes    Comment: rarely  . Drug use: No  . Sexual activity: Never    Birth control/protection: Pill  Lifestyle  . Physical activity:    Days per week: Not on file    Minutes per session: Not on file  . Stress: Not on file  Relationships  . Social connections:    Talks on phone: Not on file    Gets together: Not on file    Attends religious service: Not on file    Active member of club or organization: Not on file    Attends meetings of clubs or organizations: Not on file    Relationship status: Not on file  . Intimate partner violence:    Fear of current or ex partner: Not on file    Emotionally abused: Not on file    Physically abused: Not on file    Forced sexual activity: Not on file  Other Topics Concern  . Not on file  Social History Narrative   Single.   2 children, 1 grandchildren.   Works in [employer], Network engineer job.   Enjoys relaxing.  Past Surgical History:  Procedure Laterality Date  . CARPAL TUNNEL RELEASE    . COLONOSCOPY WITH PROPOFOL N/A 10/10/2016   Procedure: COLONOSCOPY WITH PROPOFOL;  Surgeon: Jonathon Bellows, MD;  Location: [facility];  Service: Endoscopy;  Laterality: N/A;  . EXCISION OF GANGLION CYST    . TUBAL LIGATION  1988  . VAGINAL HYSTERECTOMY  12/01/2011   Procedure: HYSTERECTOMY VAGINAL;  Surgeon: Terrance Mass, MD;  Location: [facility];  Service: Gynecology;  Laterality: Right;    Family History  Problem Relation Age of Onset  . Breast cancer Mother   . Diabetes Father   . Hypertension Father   . Heart disease Father   . Ovarian cancer Sister   . Cancer Brother        Oral  . Colon cancer Brother     No Known Allergies  Current Outpatient Medications on File Prior to Visit  Medication Sig Dispense Refill  .  albuterol (PROAIR HFA) 108 (90 Base) MCG/ACT inhaler Inhale 2 puffs into the lungs every 6 (six) hours as needed. For wheezing. 1 Inhaler 6  . Fluticasone-Salmeterol (ADVAIR DISKUS) 100-50 MCG/DOSE AEPB INHALE ONE DOSE BY MOUTH EVERY 12 HOURS 60 each 6   No current facility-administered medications on file prior to visit.     BP 140/86   Pulse 75   Temp 98.6 F (37 C) (Oral)   Ht 5\' 4"  (1.626 m)   Wt (!) 356 lb 4 oz (161.6 kg)   LMP 09/20/2011   SpO2 98%   BMI 61.15 kg/m    Objective:   Physical Exam  Constitutional: She appears well-nourished.  Neck: Neck supple.  Cardiovascular: Normal rate and regular rhythm.  Respiratory: Effort normal and breath sounds normal.  GI: Soft. Bowel sounds are normal. There is no tenderness.  Exam difficult due to large abdominal girth  Skin: Skin is warm and dry.           Assessment & Plan:

## 2018-01-05 NOTE — Patient Instructions (Signed)
Start ranitidine (Zantac) 150 mg tablets. Take 1 tablet by mouth once or twice daily, everyday for abdominal symptoms.  Avoid laying down within 2 hours after eating. Limit spicy, acidic, greasy foods. Take a look at the information below.   Please update me in 2 weeks as discussed.  It was a pleasure to see you today!   Food Choices for Gastroesophageal Reflux Disease, Adult When you have gastroesophageal reflux disease (GERD), the foods you eat and your eating habits are very important. Choosing the right foods can help ease your discomfort. What guidelines do I need to follow?  Choose fruits, vegetables, whole grains, and low-fat dairy products.  Choose low-fat meat, fish, and poultry.  Limit fats such as oils, salad dressings, butter, nuts, and avocado.  Keep a food diary. This helps you identify foods that cause symptoms.  Avoid foods that cause symptoms. These may be different for everyone.  Eat small meals often instead of 3 large meals a day.  Eat your meals slowly, in a place where you are relaxed.  Limit fried foods.  Cook foods using methods other than frying.  Avoid drinking alcohol.  Avoid drinking large amounts of liquids with your meals.  Avoid bending over or lying down until 2-3 hours after eating. What foods are not recommended? These are some foods and drinks that may make your symptoms worse: Vegetables Tomatoes. Tomato juice. Tomato and spaghetti sauce. Chili peppers. Onion and garlic. Horseradish. Fruits Oranges, grapefruit, and lemon (fruit and juice). Meats High-fat meats, fish, and poultry. This includes hot dogs, ribs, ham, sausage, salami, and bacon. Dairy Whole milk and chocolate milk. Sour cream. Cream. Butter. Ice cream. Cream cheese. Drinks Coffee and tea. Bubbly (carbonated) drinks or energy drinks. Condiments Hot sauce. Barbecue sauce. Sweets/Desserts Chocolate and cocoa. Donuts. Peppermint and spearmint. Fats and Oils High-fat  foods. This includes [place] fries and potato chips. Other Vinegar. Strong spices. This includes black pepper, white pepper, red pepper, cayenne, curry powder, cloves, ginger, and chili powder. The items listed above may not be a complete list of foods and drinks to avoid. Contact your dietitian for more information. This information is not intended to replace advice given to you by your health care provider. Make sure you discuss any questions you have with your health care provider. Document Released: 01/20/2012 Document Revised: 12/27/2015 Document Reviewed: 05/25/2013 Elsevier Interactive Patient Education  2017 [employer].

## 2018-05-17 ENCOUNTER — Emergency Department: Payer: Managed Care, Other (non HMO)

## 2018-05-17 ENCOUNTER — Other Ambulatory Visit: Payer: Self-pay

## 2018-05-17 ENCOUNTER — Encounter: Payer: Self-pay | Admitting: Intensive Care

## 2018-05-17 ENCOUNTER — Emergency Department
Admission: EM | Admit: 2018-05-17 | Discharge: 2018-05-17 | Disposition: A | Discharge status: Home / Self Care | Payer: Managed Care, Other (non HMO) | Attending: Student in an Organized Health Care Education/Training Program | Admitting: Student in an Organized Health Care Education/Training Program

## 2018-05-17 DIAGNOSIS — R1011 Right upper quadrant pain: Secondary | ICD-10-CM | POA: Diagnosis not present

## 2018-05-17 DIAGNOSIS — I10 Essential (primary) hypertension: Secondary | ICD-10-CM | POA: Insufficient documentation

## 2018-05-17 DIAGNOSIS — R1013 Epigastric pain: Secondary | ICD-10-CM | POA: Diagnosis not present

## 2018-05-17 DIAGNOSIS — Z79899 Other long term (current) drug therapy: Secondary | ICD-10-CM | POA: Diagnosis not present

## 2018-05-17 DIAGNOSIS — R110 Nausea: Secondary | ICD-10-CM | POA: Insufficient documentation

## 2018-05-17 DIAGNOSIS — J45909 Unspecified asthma, uncomplicated: Secondary | ICD-10-CM | POA: Diagnosis not present

## 2018-05-17 LAB — URINALYSIS, COMPLETE (UACMP) WITH MICROSCOPIC
GLUCOSE, UA: NEGATIVE mg/dL
Ketones, ur: 5 mg/dL — AB
Leukocytes, UA: NEGATIVE
Nitrite: NEGATIVE
PH: 5 (ref 5.0–8.0)
Protein, ur: 30 mg/dL — AB
SPECIFIC GRAVITY, URINE: 1.033 — AB (ref 1.005–1.030)

## 2018-05-17 LAB — CBC
HCT: 38.7 % (ref 36.0–46.0)
Hemoglobin: 12.2 g/dL (ref 12.0–15.0)
MCH: 26.9 pg (ref 26.0–34.0)
MCHC: 31.5 g/dL (ref 30.0–36.0)
MCV: 85.2 fL (ref 80.0–100.0)
NRBC: 0 % (ref 0.0–0.2)
PLATELETS: 336 10*3/uL (ref 150–400)
RBC: 4.54 MIL/uL (ref 3.87–5.11)
RDW: 14 % (ref 11.5–15.5)
WBC: 7 10*3/uL (ref 4.0–10.5)

## 2018-05-17 LAB — COMPREHENSIVE METABOLIC PANEL
ALK PHOS: 65 U/L (ref 38–126)
ALT: 10 U/L (ref 0–44)
ANION GAP: 7 (ref 5–15)
AST: 16 U/L (ref 15–41)
Albumin: 3.7 g/dL (ref 3.5–5.0)
BILIRUBIN TOTAL: 0.5 mg/dL (ref 0.3–1.2)
BUN: 14 mg/dL (ref 6–20)
CALCIUM: 9.5 mg/dL (ref 8.9–10.3)
CO2: 30 mmol/L (ref 22–32)
CREATININE: 0.94 mg/dL (ref 0.44–1.00)
Chloride: 105 mmol/L (ref 98–111)
GFR calc non Af Amer: >60 mL/min (ref >60)
GLUCOSE: 106 mg/dL — AB (ref 70–99)
Potassium: 3.8 mmol/L (ref 3.5–5.1)
SODIUM: 142 mmol/L (ref 135–145)
TOTAL PROTEIN: 7 g/dL (ref 6.5–8.1)

## 2018-05-17 LAB — LIPASE, BLOOD: Lipase: 26 U/L (ref 11–51)

## 2018-05-17 LAB — TROPONIN I: Troponin I: <0.03 ng/mL (ref <0.03)

## 2018-05-17 MED ORDER — PANTOPRAZOLE SODIUM 40 MG PO TBEC: 40.0000 mg | DELAYED_RELEASE_TABLET | Freq: Every day | ORAL | 1 refills | Status: DC

## 2018-05-17 MED ORDER — GI COCKTAIL ~~LOC~~
30.0000 mL | Freq: Once | ORAL | Status: AC
  Administered 2018-05-17: 30 mL via ORAL
  Filled 2018-05-17: qty 30

## 2018-05-17 MED ORDER — KETOROLAC TROMETHAMINE 30 MG/ML IJ SOLN: 30.0000 mg | Freq: Once | INTRAMUSCULAR | Status: DC

## 2018-05-17 MED ORDER — KETOROLAC TROMETHAMINE 30 MG/ML IJ SOLN
30.0000 mg | Freq: Once | INTRAMUSCULAR | Status: AC
  Administered 2018-05-17: 30 mg via INTRAMUSCULAR
  Filled 2018-05-17: qty 1

## 2018-05-17 NOTE — ED Triage Notes (Signed)
Patient c/o epigastric pain X1 week with nausea. Reports may be due to gallbladder issues

## 2018-05-17 NOTE — Discharge Instructions (Signed)

## 2018-05-17 NOTE — ED Provider Notes (Signed)
[facility] Emergency Department Provider Note    First MD Initiated Contact with Patient 05/17/18 1658     (approximate)  I have reviewed the triage vital signs and the nursing notes.   HISTORY  Chief Complaint Abdominal Pain    HPI Stacy Meyer is a 57 y.o. female with a history of epigastric pain and reflux presents the ER with 1 week of intermittent epigastric pain that she feels like "cramping "sensation that happens abruptly particularly when she is eating and then will subside.  States she does have some associated nausea but has not had any vomiting.  Denies any fevers.  No chest pain or shortness of breath.  Has had symptoms like this in the past but never this severe.    Past Medical History:  Diagnosis Date  . Anemia 02/2009  . Asthma   . Blood transfusion 2011   [place]  . Morbid obesity ([place])    Family History  Problem Relation Age of Onset  . Breast cancer Mother   . Diabetes Father   . Hypertension Father   . Heart disease Father   . Ovarian cancer Sister   . Cancer Brother        Oral  . Colon cancer Brother    Past Surgical History:  Procedure Laterality Date  . CARPAL TUNNEL RELEASE    . COLONOSCOPY WITH PROPOFOL N/A 10/10/2016   Procedure: COLONOSCOPY WITH PROPOFOL;  Surgeon: Jonathon Bellows, MD;  Location: [facility] ENDOSCOPY;  Service: Endoscopy;  Laterality: N/A;  . EXCISION OF GANGLION CYST    . TUBAL LIGATION  1988  . VAGINAL HYSTERECTOMY  12/01/2011   Procedure: HYSTERECTOMY VAGINAL;  Surgeon: Terrance Mass, MD;  Location: [facility];  Service: Gynecology;  Laterality: Right;   Patient Active Problem List   Diagnosis Date Noted  . Epigastric pain 01/05/2018  . Preventative health care 07/03/2017  . GERD (gastroesophageal reflux disease) 07/03/2017  . Hyperlipidemia 07/03/2017  . Prediabetes 02/16/2017  . Pedal edema 01/06/2013  . Chronic pain of right knee 01/06/2013  . Morbid obesity ([place])   . Mild intermittent  asthma   . Allergic rhinitis 03/22/2010  . Hypertension, essential 03/22/2010      Prior to Admission medications   Medication Sig Start Date End Date Taking? Authorizing Provider  albuterol (PROAIR HFA) 108 (90 Base) MCG/ACT inhaler Inhale 2 puffs into the lungs every 6 (six) hours as needed. For wheezing. 01/29/17   Ria Bush, MD  Fluticasone-Salmeterol (ADVAIR DISKUS) 100-50 MCG/DOSE AEPB INHALE ONE DOSE BY MOUTH EVERY 12 HOURS 01/29/17   Ria Bush, MD  pantoprazole (PROTONIX) 40 MG tablet Take 1 tablet (40 mg total) by mouth daily. 05/17/18 05/17/19  Merlyn Lot, MD  ranitidine (ZANTAC) 150 MG tablet Take 1 tablet by mouth once or twice daily for acid reflux. 01/05/18   [name], NP    Allergies Patient has no known allergies.    Social History Social History   Tobacco Use  . Smoking status: Never Smoker  . Smokeless tobacco: Never Used  Substance Use Topics  . Alcohol use: Yes    Comment: rarely  . Drug use: No    Review of Systems Patient denies headaches, rhinorrhea, blurry vision, numbness, shortness of breath, chest pain, edema, cough, abdominal pain, nausea, vomiting, diarrhea, dysuria, fevers, rashes or hallucinations unless otherwise stated above in HPI. ____________________________________________   PHYSICAL EXAM:  VITAL SIGNS: Vitals:   05/17/18 1625  BP: (!) 168/93  Pulse:  96  Resp: 18  Temp: 98.3 F (36.8 C)  SpO2: 98%    Constitutional: Alert and oriented.  Eyes: Conjunctivae are normal.  Head: Atraumatic. Nose: No congestion/rhinnorhea. Mouth/Throat: Mucous membranes are moist.   Neck: No stridor. Painless ROM.  Cardiovascular: Normal rate, regular rhythm. Grossly normal heart sounds.  Good peripheral circulation. Respiratory: Normal respiratory effort.  No retractions. Lungs CTAB. Gastrointestinal: Soft and nontender.  Morbidly obese No distention. No abdominal bruits. No CVA tenderness. Genitourinary:    Musculoskeletal: No lower extremity tenderness nor edema.  No joint effusions. Neurologic:  Normal speech and language. No gross focal neurologic deficits are appreciated. No facial droop Skin:  Skin is warm, dry and intact. No rash noted. Psychiatric: Mood and affect are normal. Speech and behavior are normal.  ____________________________________________   LABS (all labs ordered are listed, but only abnormal results are displayed)  Results for orders placed or performed during the hospital encounter of 05/17/18 (from the past 24 hour(s))  Lipase, blood     Status: None   Collection Time: 05/17/18  4:34 PM  Result Value Ref Range   Lipase 26 11 - 51 U/L  Comprehensive metabolic panel     Status: Abnormal   Collection Time: 05/17/18  4:34 PM  Result Value Ref Range   Sodium 142 135 - 145 mmol/L   Potassium 3.8 3.5 - 5.1 mmol/L   Chloride 105 98 - 111 mmol/L   CO2 30 22 - 32 mmol/L   Glucose, Bld 106 (H) 70 - 99 mg/dL   BUN 14 6 - 20 mg/dL   Creatinine, Ser 0.94 0.44 - 1.00 mg/dL   Calcium 9.5 8.9 - 10.3 mg/dL   Total Protein 7.0 6.5 - 8.1 g/dL   Albumin 3.7 3.5 - 5.0 g/dL   AST 16 15 - 41 U/L   ALT 10 0 - 44 U/L   Alkaline Phosphatase 65 38 - 126 U/L   Total Bilirubin 0.5 0.3 - 1.2 mg/dL   GFR calc non Af Amer >60 >60 mL/min   GFR calc Af Amer >60 >60 mL/min   Anion gap 7 5 - 15  CBC     Status: None   Collection Time: 05/17/18  4:34 PM  Result Value Ref Range   WBC 7.0 4.0 - 10.5 K/uL   RBC 4.54 3.87 - 5.11 MIL/uL   Hemoglobin 12.2 12.0 - 15.0 g/dL   HCT 38.7 36.0 - 46.0 %   MCV 85.2 80.0 - 100.0 fL   MCH 26.9 26.0 - 34.0 pg   MCHC 31.5 30.0 - 36.0 g/dL   RDW 14.0 11.5 - 15.5 %   Platelets 336 150 - 400 K/uL   nRBC 0.0 0.0 - 0.2 %  Urinalysis, Complete w Microscopic     Status: Abnormal   Collection Time: 05/17/18  4:34 PM  Result Value Ref Range   Color, Urine AMBER (A) YELLOW   APPearance HAZY (A) CLEAR   Specific Gravity, Urine 1.033 (H) 1.005 - 1.030   pH  5.0 5.0 - 8.0   Glucose, UA NEGATIVE NEGATIVE mg/dL   Hgb urine dipstick MODERATE (A) NEGATIVE   Bilirubin Urine SMALL (A) NEGATIVE   Ketones, ur 5 (A) NEGATIVE mg/dL   Protein, ur 30 (A) NEGATIVE mg/dL   Nitrite NEGATIVE NEGATIVE   Leukocytes, UA NEGATIVE NEGATIVE   RBC / HPF 21-50 0 - 5 RBC/hpf   WBC, UA 6-10 0 - 5 WBC/hpf   Bacteria, UA RARE (A) NONE SEEN  Squamous Epithelial / LPF 6-10 0 - 5   Mucus PRESENT   Troponin I     Status: None   Collection Time: 05/17/18  4:34 PM  Result Value Ref Range   Troponin I <0.03 <0.03 ng/mL   ____________________________________________  EKG My review and personal interpretation at Time: 16:31   Indication: epigastric pain,  Rate: 80  Rhythm: sinus Axis: normal Other: normal intervals, no stemi ____________________________________________  RADIOLOGY  I personally reviewed all radiographic images ordered to evaluate for the above acute complaints and reviewed radiology reports and findings.  These findings were personally discussed with the patient.  Please see medical record for radiology report.  ____________________________________________   PROCEDURES  Procedure(s) performed:  Procedures    Critical Care performed: no ____________________________________________   INITIAL IMPRESSION / ASSESSMENT AND PLAN / ED COURSE  Pertinent labs & imaging results that were available during my care of the patient were reviewed by me and considered in my medical decision making (see chart for details).   DDX: cholelithiasis, cholecystitis,  Stacy Meyer is a 57 y.o. who presents to the ED with colicky epigastric pain as described above.  She is afebrile He[place] dynamically stable.  Exam does show some epigastric and right upper quadrant pain therefore right upper quadrant ultrasound was ordered.  Blood work is reassuring shows no evidence of biliary obstruction, pancreatitis or leukocytosis.  Ultrasound is reassuring.  Patient  tolerating oral hydration and symptoms improved after IM Toradol as well as GI cocktail.  Not clinically consistent with ACS.  EKG shows no ischemia and troponin is negative.  Have discussed with the patient and available family all diagnostics and treatments performed thus far and all questions were answered to the best of my ability. The patient demonstrates understanding and agreement with plan.        As part of my medical decision making, I reviewed the following data within the [facility] notes reviewed and incorporated, Labs reviewed, notes from prior ED visits and [place] Controlled Substance Database   ____________________________________________   FINAL CLINICAL IMPRESSION(S) / ED DIAGNOSES  Final diagnoses:  RUQ pain  Epigastric pain      NEW MEDICATIONS STARTED DURING THIS VISIT:  New Prescriptions   PANTOPRAZOLE (PROTONIX) 40 MG TABLET    Take 1 tablet (40 mg total) by mouth daily.     Note:  This document was prepared using Dragon voice recognition software and may include unintentional dictation errors.    Merlyn Lot, MD 05/17/18 [phone]

## 2018-05-24 ENCOUNTER — Encounter

## 2018-05-24 ENCOUNTER — Ambulatory Visit: Payer: Managed Care, Other (non HMO) | Admitting: Primary Care

## 2018-05-24 ENCOUNTER — Encounter: Payer: Self-pay | Admitting: Primary Care

## 2018-05-24 VITALS — BP 118/78 | HR 79 | Temp 98.6°F | Ht 64.0 in | Wt 359.0 lb

## 2018-05-24 DIAGNOSIS — K219 Gastro-esophageal reflux disease without esophagitis: Secondary | ICD-10-CM

## 2018-05-24 DIAGNOSIS — R1013 Epigastric pain: Secondary | ICD-10-CM

## 2018-05-24 NOTE — Assessment & Plan Note (Signed)
Improved on pantoprazole 40 mg. Continue same.

## 2018-05-24 NOTE — Progress Notes (Signed)
Subjective:    Patient ID: Stacy Meyer, female    DOB: 01/25/1961, 57 y.o.   MRN: [id number]  HPI  Stacy Meyer is a 57 year old female who presents today for emergency department follow up.  She presented to [facility] ED on 05/17/18 with a chief complaint of epigastric pain. She described the pain as cramping that occurs after eating then will subside.   During her stay in the ED she was provided with IM toradol and a GI cocktail. Testing including ECG and troponin were unremarkable and not suggestive for ACS. She also underwent RUQ abdominal ultrasound which was without acute process. She was discharged home with a prescription for pantoprazole 40 mg and encouraged to follow up with PCP.  Since her ED visit she's experienced intermittent spasms to the epigastric region. She is compliant to pantoprazole 40 mg daily with improvement in GERD symptoms. Her pain will occur with and without eating, some nausea without vomiting. She has a history of this pain that has been intermittent for several months.  She denies abdominal pain elsewhere besides her epigastric region. She denies bloody stools, fevers, vomiting, constipation, diarrhea.   Review of Systems  Constitutional: Negative for fever.  Gastrointestinal: Positive for abdominal pain. Negative for blood in stool, constipation, diarrhea, nausea and vomiting.       Past Medical History:  Diagnosis Date  . Anemia 02/2009  . Asthma   . Blood transfusion 2011   [place]  . Morbid obesity ([place])      Social History   Socioeconomic History  . Marital status: Single    Spouse name: Not on file  . Number of children: Not on file  . Years of education: Not on file  . Highest education level: Not on file  Occupational History  . Not on file  Social Needs  . Financial resource strain: Not on file  . Food insecurity:    Worry: Not on file    Inability: Not on file  . Transportation needs:    Medical: Not on file    Non-medical:  Not on file  Tobacco Use  . Smoking status: Never Smoker  . Smokeless tobacco: Never Used  Substance and Sexual Activity  . Alcohol use: Yes    Comment: rarely  . Drug use: No  . Sexual activity: Never    Birth control/protection: Pill  Lifestyle  . Physical activity:    Days per week: Not on file    Minutes per session: Not on file  . Stress: Not on file  Relationships  . Social connections:    Talks on phone: Not on file    Gets together: Not on file    Attends religious service: Not on file    Active member of club or organization: Not on file    Attends meetings of clubs or organizations: Not on file    Relationship status: Not on file  . Intimate partner violence:    Fear of current or ex partner: Not on file    Emotionally abused: Not on file    Physically abused: Not on file    Forced sexual activity: Not on file  Other Topics Concern  . Not on file  Social History Narrative   Single.   2 children, 1 grandchildren.   Works in [employer], Network engineer job.   Enjoys relaxing.    Past Surgical History:  Procedure Laterality Date  . CARPAL TUNNEL RELEASE    . COLONOSCOPY WITH PROPOFOL  N/A 10/10/2016   Procedure: COLONOSCOPY WITH PROPOFOL;  Surgeon: Jonathon Bellows, MD;  Location: [facility] END[facility]COPY;  Service: Endoscopy;  Laterality: N/A;  . EXCISION OF GANGLION CYST    . TUBAL LIGATION  1988  . VAGINAL HYSTERECTOMY  12/01/2011   Procedure: HYSTERECTOMY VAGINAL;  Surgeon: Terrance Mass, MD;  Location: [facility];  Service: Gynecology;  Laterality: Right;    Family History  Problem Relation Age of Onset  . Breast cancer Mother   . Diabetes Father   . Hypertension Father   . Heart disease Father   . Ovarian cancer Sister   . Cancer Brother        Oral  . Colon cancer Brother     No Known Allergies  Current Outpatient Medications on File Prior to Visit  Medication Sig Dispense Refill  . albuterol (PROAIR HFA) 108 (90 Base) MCG/ACT inhaler Inhale 2 puffs into the lungs every 6  (six) hours as needed. For wheezing. 1 Inhaler 6  . Fluticasone-Salmeterol (ADVAIR DISKUS) 100-50 MCG/DOSE AEPB INHALE ONE DOSE BY MOUTH EVERY 12 HOURS 60 each 6  . pantoprazole (PROTONIX) 40 MG tablet Take 1 tablet (40 mg total) by mouth daily. 30 tablet 1   No current facility-administered medications on file prior to visit.     BP 118/78   Pulse 79   Temp 98.6 F (37 C) (Oral)   Ht 5\' 4"  (1.626 m)   Wt (!) 359 lb (162.8 kg) Comment: wearing steel toe shoes  LMP 09/20/2011   BMI 61.62 kg/m    Objective:   Physical Exam  Constitutional: She appears well-nourished.  Neck: Neck supple.  Cardiovascular: Normal rate and regular rhythm.  Respiratory: Effort normal and breath sounds normal.  GI: Soft. Bowel sounds are normal. There is no tenderness.  Skin: Skin is warm and dry.           Assessment & Plan:

## 2018-05-24 NOTE — Assessment & Plan Note (Signed)
Intermittent since Summer 2019, episodes increasing.  Exam today without abnormality. Question hiatal hernia vs esophageal spasm.  Given persistent intermittent symptoms despite high dose PPI treatment, will refer her back to her GI doctor through [facility] for evaluation.  All ED notes, labs, imaging reviewed.

## 2018-05-24 NOTE — Patient Instructions (Addendum)
Stop by the front desk and speak with either Rosaria Ferries or Anastasiya regarding your referral to GI.  Continue taking pantoprazole 40 mg tablets for your symptoms.   Be sure to reduce consumption of fatty, greasy, spicy, fried food.  It was a pleasure to see you today!

## 2018-08-17 ENCOUNTER — Ambulatory Visit: Payer: No Typology Code available for payment source | Admitting: Primary Care

## 2018-08-17 ENCOUNTER — Encounter: Payer: Self-pay | Admitting: Primary Care

## 2018-08-17 ENCOUNTER — Other Ambulatory Visit: Payer: Self-pay

## 2018-08-17 VITALS — BP 136/82 | HR 73 | Temp 98.6°F | Wt 355.0 lb

## 2018-08-17 DIAGNOSIS — J069 Acute upper respiratory infection, unspecified: Secondary | ICD-10-CM | POA: Diagnosis not present

## 2018-08-17 DIAGNOSIS — J4521 Mild intermittent asthma with (acute) exacerbation: Secondary | ICD-10-CM | POA: Diagnosis not present

## 2018-08-17 DIAGNOSIS — B9789 Other viral agents as the cause of diseases classified elsewhere: Secondary | ICD-10-CM

## 2018-08-17 DIAGNOSIS — J4520 Mild intermittent asthma, uncomplicated: Secondary | ICD-10-CM

## 2018-08-17 MED ORDER — ALBUTEROL SULFATE HFA 108 (90 BASE) MCG/ACT IN AERS: 2.0000 | INHALATION_SPRAY | Freq: Four times a day (QID) | RESPIRATORY_TRACT | 0 refills | Status: DC | PRN

## 2018-08-17 MED ORDER — FLUTICASONE-SALMETEROL 100-50 MCG/DOSE IN AEPB: INHALATION_SPRAY | RESPIRATORY_TRACT | 6 refills | Status: DC

## 2018-08-17 NOTE — Telephone Encounter (Signed)
Pt left v/m; pt seen earlier today. Pt thought was supposed to get refill for albuterol & advair and pharmacy does not have rxs. Pt request cb.

## 2018-08-17 NOTE — Progress Notes (Signed)
Subjective:    Patient ID: Stacy Meyer, female    DOB: 1961/05/30, 57 y.o.   MRN: [id number]  HPI  Stacy Meyer is a 58 year old female with a history of allergic rhinitis, mild intermittent asthma who presents today with a chief complaint of cough.  She also reports rhinorrhea, chills, sweats, wheezing at night. Her symptoms began four evenings ago. She's not taken anything OTC for her symptoms. She didn't check her temperature. She is using her albuterol inhaler as needed, used it three times since her symptoms began. She is not using her Advair inhaler. She was exposed to influenza 5 days ago by a co-worker. She denies chest tightness, shortness of breath  Review of Systems  Constitutional: Positive for chills. Negative for fever.  HENT: Negative for congestion, sinus pressure and sore throat.   Respiratory: Positive for cough and wheezing.        Past Medical History:  Diagnosis Date  . Anemia 02/2009  . Asthma   . Blood transfusion 2011   [place]  . Morbid obesity ([place])      Social History   Socioeconomic History  . Marital status: Single    Spouse name: Not on file  . Number of children: Not on file  . Years of education: Not on file  . Highest education level: Not on file  Occupational History  . Not on file  Social Needs  . Financial resource strain: Not on file  . Food insecurity:    Worry: Not on file    Inability: Not on file  . Transportation needs:    Medical: Not on file    Non-medical: Not on file  Tobacco Use  . Smoking status: Never Smoker  . Smokeless tobacco: Never Used  Substance and Sexual Activity  . Alcohol use: Yes    Comment: rarely  . Drug use: No  . Sexual activity: Never    Birth control/protection: Pill  Lifestyle  . Physical activity:    Days per week: Not on file    Minutes per session: Not on file  . Stress: Not on file  Relationships  . Social connections:    Talks on phone: Not on file    Gets together: Not on file     Attends religious service: Not on file    Active member of club or organization: Not on file    Attends meetings of clubs or organizations: Not on file    Relationship status: Not on file  . Intimate partner violence:    Fear of current or ex partner: Not on file    Emotionally abused: Not on file    Physically abused: Not on file    Forced sexual activity: Not on file  Other Topics Concern  . Not on file  Social History Narrative   Single.   2 children, 1 grandchildren.   Works in [employer], Network engineer job.   Enjoys relaxing.    Past Surgical History:  Procedure Laterality Date  . CARPAL TUNNEL RELEASE    . COLONOSCOPY WITH PROPOFOL N/A 10/10/2016   Procedure: COLONOSCOPY WITH PROPOFOL;  Surgeon: Jonathon Bellows, MD;  Location: [facility] ENDOSCOPY;  Service: Endoscopy;  Laterality: N/A;  . EXCISION OF GANGLION CYST    . TUBAL LIGATION  1988  . VAGINAL HYSTERECTOMY  12/01/2011   Procedure: HYSTERECTOMY VAGINAL;  Surgeon: Terrance Mass, MD;  Location: [facility];  Service: Gynecology;  Laterality: Right;    Family History  Problem Relation  Age of Onset  . Breast cancer Mother   . Diabetes Father   . Hypertension Father   . Heart disease Father   . Ovarian cancer Sister   . Cancer Brother        Oral  . Colon cancer Brother     No Known Allergies  Current Outpatient Medications on File Prior to Visit  Medication Sig Dispense Refill  . pantoprazole (PROTONIX) 40 MG tablet Take 1 tablet (40 mg total) by mouth daily. 30 tablet 1   No current facility-administered medications on file prior to visit.     BP 136/82 (BP Location: Right Arm, Patient Position: Sitting, Cuff Size: Large)   Pulse 73   Temp 98.6 F (37 C) (Oral)   Wt (!) 355 lb (161 kg)   LMP 09/20/2011   SpO2 97%   BMI 60.94 kg/m    Objective:   Physical Exam  Constitutional: She appears well-nourished. She does not appear ill.  HENT:  Right Ear: Tympanic membrane and ear canal normal.  Left Ear: Tympanic  membrane and ear canal normal.  Nose: No mucosal edema. Right sinus exhibits no maxillary sinus tenderness and no frontal sinus tenderness. Left sinus exhibits no maxillary sinus tenderness and no frontal sinus tenderness.  Mouth/Throat: Oropharynx is clear and moist.  Neck: Neck supple.  Cardiovascular: Normal rate and regular rhythm.  Respiratory: Effort normal and breath sounds normal. She has no wheezes.  Skin: Skin is warm and dry.           Assessment & Plan:  Viral URI with early Asthma Exacerbation:  Symptoms x 4 days, exposed to influenza 5 days ago. Doesn't exhibit symptoms of influenza, is outside of window for treatment. Suspect viral URI with asthma involvement. Exam today is overall benign. Will have her use her albuterol inhaler at bedtime for the next 3-5 nights. Also offered prednisone burst but she kindly declines. Given benign exam and lack of other asthma symptoms this seems reasonable. Also discussed for her to resume her Advair if she requires her albuterol inhaler more than three times weekly after URI symptoms improve.  [name], NP

## 2018-08-17 NOTE — Telephone Encounter (Signed)
She did not mention to me that she needed refills. Please notify patient of the Advair inhaler is used daily for asthma symptoms, the albuterol inhaler is only used as needed as discussed.  She can use albuterol inhaler, 2 puffs at bedtime for the next 4 to 5 days to help with symptoms.  If she continues to use the albuterol more than 3 times weekly after she recovers and she will need to transition to the Advair inhaler.  Refill sent to pharmacy.

## 2018-08-17 NOTE — Patient Instructions (Signed)
Your symptoms are representative of a viral illness which will resolve on its own over time. Our goal is to treat your symptoms in order to aid your body in the healing process and to make you more comfortable.   Use 2 puffs of your albuterol inhaler at bedtime for the next 3-4 nights for wheezing/shortness of breath.  Start Zyrtec or Xyzal once nightly for sinus and throat drainage.  Please notify me if you develop persistent fevers of 101, notice increased fatigue or weakness, notice shortness of breath, increased wheezing, chest tightness, and/or feel worse after 1 week of onset of symptoms.   Increase consumption of water intake and rest.  It was a pleasure to see you today!

## 2018-08-18 NOTE — Telephone Encounter (Signed)
Spoke with pt to inform.  

## 2018-08-18 NOTE — Telephone Encounter (Signed)
Pt returning call to nurse. Please call pt °

## 2018-08-18 NOTE — Telephone Encounter (Signed)
LVM for pt to call back and discuss.  

## 2018-10-20 ENCOUNTER — Telehealth: Payer: Self-pay | Admitting: Primary Care

## 2018-10-20 ENCOUNTER — Other Ambulatory Visit: Payer: Self-pay | Admitting: Primary Care

## 2018-10-20 DIAGNOSIS — Z1231 Encounter for screening mammogram for malignant neoplasm of breast: Secondary | ICD-10-CM

## 2018-10-20 NOTE — Telephone Encounter (Signed)
Looks like when pt called to schedule screening mammogram she told them she found lump.  Do you want me to call and schedule appointment with you

## 2018-10-20 NOTE — Telephone Encounter (Signed)
Given that she is due for screening mammogram, can we just order a diagnostic mammogram and ultrasound per their recommendations?

## 2018-10-20 NOTE — Telephone Encounter (Signed)
Mammogram is ordered in our system today under my name. I do not see "co-sign" orders as of yet.  Stacy Meyer, can you take a look?

## 2018-10-20 NOTE — Telephone Encounter (Signed)
[facility]. Please advise pt

## 2018-10-21 ENCOUNTER — Other Ambulatory Visit: Payer: Self-pay | Admitting: Primary Care

## 2018-10-21 DIAGNOSIS — N6320 Unspecified lump in the left breast, unspecified quadrant: Secondary | ICD-10-CM

## 2018-10-21 NOTE — Telephone Encounter (Signed)
Spoke with Joaquim Lai @ [facility] center she put orders in for [facility].  She stated pt insurance was out of network.  I called pt and transferred her to Joaquim Lai to schedule if pt wants to/Frances was going to advise pt they were out of network

## 2018-10-22 NOTE — Telephone Encounter (Signed)
Appointment 3/24 Pt aware insurance is out network

## 2018-10-26 ENCOUNTER — Other Ambulatory Visit: Payer: Self-pay | Admitting: Primary Care

## 2018-10-26 ENCOUNTER — Ambulatory Visit
Admission: RE | Admit: 2018-10-26 | Discharge: 2018-10-26 | Disposition: A | Discharge status: Home / Self Care | Payer: No Typology Code available for payment source | Source: Ambulatory Visit | Attending: Primary Care | Admitting: Primary Care

## 2018-10-26 ENCOUNTER — Other Ambulatory Visit: Payer: Self-pay

## 2018-10-26 DIAGNOSIS — N6320 Unspecified lump in the left breast, unspecified quadrant: Secondary | ICD-10-CM

## 2019-01-05 ENCOUNTER — Encounter: Payer: Self-pay | Admitting: Primary Care

## 2019-01-05 ENCOUNTER — Ambulatory Visit (INDEPENDENT_AMBULATORY_CARE_PROVIDER_SITE_OTHER): Payer: No Typology Code available for payment source | Admitting: Primary Care

## 2019-01-05 DIAGNOSIS — Z20828 Contact with and (suspected) exposure to other viral communicable diseases: Secondary | ICD-10-CM | POA: Diagnosis not present

## 2019-01-05 DIAGNOSIS — Z20822 Contact with and (suspected) exposure to covid-19: Secondary | ICD-10-CM

## 2019-01-05 NOTE — Patient Instructions (Signed)
We will keep you at home for the next 7 days.  Be sure to call me if you develop fevers, cough, fatigue, increased shortness of breath.  It was a pleasure to see you today! Allie Bossier, NP-C

## 2019-01-05 NOTE — Progress Notes (Signed)
Subjective:    Patient ID: Stacy Meyer, female    DOB: 1960-12-16, 58 y.o.   MRN: [id number]  HPI  Virtual Visit via Video Note  I connected with Stacy Meyer on 01/05/19 at 11:00 AM EDT by a video enabled telemedicine application and verified that I am speaking with the correct person using two identifiers.  Location: Patient: Home Provider: Office   I discussed the limitations of evaluation and management by telemedicine and the availability of in person appointments. The patient expressed understanding and agreed to proceed.  History of Present Illness:  Stacy Meyer is a 58 year old female with a history of asthma, allergic rhinitis, GERD who presents today with a chief complaint of Covid-19 exposure.   There was a positive case of Covid-19 at her place of employment, she found out Monday this week. She has been in direct contact with this employee with last direct contact being 8 days ago. Everyone has been wearing masks at work but they will lower their masks at times to talk with one another, and they do not wear masks when eating lunch together in the breakroom. 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   She has had some nasal pressure, rhinorrhea, sneezing, mild shortness of breat which improved with Flonase and albuterol. She feels like these symptoms are from her typical allergy symptoms. She denies fevers, cough, fatigue, increased shortness of breath.    Observations/Objective:  Alert and oriented. Appears well, not sickly. No distress. Speaking in complete sentences. No cough during exam.  Assessment and Plan:  Exposed to Covid-19 at work, dose have underlying medial conditions that place her at high risk. Will agree to keep her out of work for a full 14 days from last exposure, this will have her going back to work on Wednesday June 10th.  Symptoms today seem more like general allergies, she will monitor and report any changes.   Follow Up Instructions:  We will keep you at home for the next 7 days.  Be sure to call me if you develop fevers, cough, fatigue, increased shortness of breath.  It was a pleasure to see you today! Stacy Bossier, NP-C    I discussed the assessment and treatment plan with the patient. The patient was provided an opportunity to ask questions and all were answered. The patient agreed with the plan and demonstrated an understanding of the instructions.   The patient was advised to call back or seek an in-person evaluation if the symptoms worsen or if the condition fails to improve as anticipated.     Stacy Koch, NP    Review of Systems  Constitutional: Negative for chills, fatigue and fever.  HENT: Positive for congestion, rhinorrhea and sinus pressure. Negative for ear pain and sore throat.   Respiratory: Positive for shortness of breath. Negative for cough.   Allergic/Immunologic: Positive for environmental allergies.       Past Medical History:  Diagnosis Date  . Anemia 02/2009  . Asthma   . Blood transfusion 2011   [place]  . Morbid obesity ([place])      Social History   Socioeconomic History  . Marital status: Single    Spouse name: Not on file  . Number of children: Not on file  . Years of education: Not on file  . Highest education level: Not on file  Occupational History  .  Not on file  Social Needs  . Financial resource strain: Not on file  . Food insecurity:    Worry: Not on file    Inability: Not on file  . Transportation needs:    Medical: Not on file    Non-medical: Not on file  Tobacco Use  . Smoking status: Never Smoker  . Smokeless tobacco: Never Used  Substance and Sexual Activity  . Alcohol use: Yes    Comment: rarely  . Drug use: No  . Sexual activity: Never    Birth control/protection: Pill  Lifestyle  . Physical activity:    Days per  week: Not on file    Minutes per session: Not on file  . Stress: Not on file  Relationships  . Social connections:    Talks on phone: Not on file    Gets together: Not on file    Attends religious service: Not on file    Active member of club or organization: Not on file    Attends meetings of clubs or organizations: Not on file    Relationship status: Not on file  . Intimate partner violence:    Fear of current or ex partner: Not on file    Emotionally abused: Not on file    Physically abused: Not on file    Forced sexual activity: Not on file  Other Topics Concern  . Not on file  Social History Narrative   Single.   2 children, 1 grandchildren.   Works in [employer], Network engineer job.   Enjoys relaxing.    Past Surgical History:  Procedure Laterality Date  . CARPAL TUNNEL RELEASE    . COLONOSCOPY WITH PROPOFOL N/A 10/10/2016   Procedure: COLONOSCOPY WITH PROPOFOL;  Surgeon: Jonathon Bellows, MD;  Location: [facility] END[facility]COPY;  Service: Endoscopy;  Laterality: N/A;  . EXCISION OF GANGLION CYST    . TUBAL LIGATION  1988  . VAGINAL HYSTERECTOMY  12/01/2011   Procedure: HYSTERECTOMY VAGINAL;  Surgeon: Terrance Mass, MD;  Location: [facility];  Service: Gynecology;  Laterality: Right;    Family History  Problem Relation Age of Onset  . Breast cancer Mother   . Diabetes Father   . Hypertension Father   . Heart disease Father   . Ovarian cancer Sister   . Cancer Brother        Oral  . Colon cancer Brother     No Known Allergies  Current Outpatient Medications on File Prior to Visit  Medication Sig Dispense Refill  . albuterol (PROAIR HFA) 108 (90 Base) MCG/ACT inhaler Inhale 2 puffs into the lungs every 6 (six) hours as needed. For wheezing. 1 Inhaler 0  . Fluticasone-Salmeterol (ADVAIR DISKUS) 100-50 MCG/DOSE AEPB INHALE ONE DOSE BY MOUTH EVERY 12 HOURS 60 each 6   No current facility-administered medications on file prior to visit.     BP (!) 141/92   Temp 97.8 F (36.6 C) (Oral)    Wt (!) 357 lb 5 oz (162.1 kg)   LMP 09/20/2011   BMI 61.33 kg/m    Objective:   Physical Exam  Constitutional: She is oriented to person, place, and time. She appears well-nourished. She does not have a sickly appearance. She does not appear ill.  Respiratory: Effort normal. No respiratory distress.  Neurological: She is alert and oriented to person, place, and time.  Psychiatric: She has a normal mood and affect.           Assessment & Plan:

## 2019-01-05 NOTE — Assessment & Plan Note (Signed)
Exposed to Covid-19 at work, dose have underlying medial conditions that place her at high risk. Will agree to keep her out of work for a full 14 days from last exposure, this will have her going back to work on Wednesday June 10th. Symptoms today seem more like general allergies, she will monitor and report any changes.

## 2019-02-07 ENCOUNTER — Other Ambulatory Visit: Payer: No Typology Code available for payment source

## 2019-05-20 ENCOUNTER — Encounter: Payer: Self-pay | Admitting: *Deleted

## 2019-05-20 ENCOUNTER — Telehealth: Payer: Self-pay

## 2019-05-20 ENCOUNTER — Emergency Department
Admission: EM | Admit: 2019-05-20 | Discharge: 2019-05-20 | Disposition: A | Discharge status: Home / Self Care | Payer: PRIVATE HEALTH INSURANCE | Attending: Emergency Medicine | Admitting: Emergency Medicine

## 2019-05-20 ENCOUNTER — Other Ambulatory Visit: Payer: Self-pay

## 2019-05-20 ENCOUNTER — Emergency Department: Payer: PRIVATE HEALTH INSURANCE

## 2019-05-20 DIAGNOSIS — J9801 Acute bronchospasm: Secondary | ICD-10-CM | POA: Diagnosis not present

## 2019-05-20 DIAGNOSIS — R0602 Shortness of breath: Secondary | ICD-10-CM | POA: Diagnosis present

## 2019-05-20 DIAGNOSIS — I10 Essential (primary) hypertension: Secondary | ICD-10-CM | POA: Diagnosis not present

## 2019-05-20 HISTORY — DX: Essential (primary) hypertension: I10

## 2019-05-20 LAB — CBC WITH DIFFERENTIAL/PLATELET
Abs Immature Granulocytes: 0.02 10*3/uL (ref 0.00–0.07)
Basophils Absolute: 0.1 10*3/uL (ref 0.0–0.1)
Basophils Relative: 1 %
Eosinophils Absolute: 0.3 10*3/uL (ref 0.0–0.5)
Eosinophils Relative: 5 %
HCT: 38.4 % (ref 36.0–46.0)
Hemoglobin: 12.1 g/dL (ref 12.0–15.0)
Immature Granulocytes: 0 %
Lymphocytes Relative: 50 %
Lymphs Abs: 2.9 10*3/uL (ref 0.7–4.0)
MCH: 27 pg (ref 26.0–34.0)
MCHC: 31.5 g/dL (ref 30.0–36.0)
MCV: 85.7 fL (ref 80.0–100.0)
Monocytes Absolute: 0.6 10*3/uL (ref 0.1–1.0)
Monocytes Relative: 11 %
Neutro Abs: 2 10*3/uL (ref 1.7–7.7)
Neutrophils Relative %: 33 %
Platelets: 305 10*3/uL (ref 150–400)
RBC: 4.48 MIL/uL (ref 3.87–5.11)
RDW: 14.1 % (ref 11.5–15.5)
WBC: 5.9 10*3/uL (ref 4.0–10.5)
nRBC: 0 % (ref 0.0–0.2)

## 2019-05-20 LAB — COMPREHENSIVE METABOLIC PANEL
ALT: 10 U/L (ref 0–44)
AST: 16 U/L (ref 15–41)
Albumin: 3.5 g/dL (ref 3.5–5.0)
Alkaline Phosphatase: 58 U/L (ref 38–126)
Anion gap: 7 (ref 5–15)
BUN: 12 mg/dL (ref 6–20)
CO2: 29 mmol/L (ref 22–32)
Calcium: 8.7 mg/dL — ABNORMAL LOW (ref 8.9–10.3)
Chloride: 104 mmol/L (ref 98–111)
Creatinine, Ser: 0.9 mg/dL (ref 0.44–1.00)
GFR calc Af Amer: >60 mL/min (ref >60)
GFR calc non Af Amer: >60 mL/min (ref >60)
Glucose, Bld: 108 mg/dL — ABNORMAL HIGH (ref 70–99)
Potassium: 3.6 mmol/L (ref 3.5–5.1)
Sodium: 140 mmol/L (ref 135–145)
Total Bilirubin: 0.5 mg/dL (ref 0.3–1.2)
Total Protein: 7.1 g/dL (ref 6.5–8.1)

## 2019-05-20 LAB — BRAIN NATRIURETIC PEPTIDE: B Natriuretic Peptide: 44 pg/mL (ref 0.0–100.0)

## 2019-05-20 MED ORDER — IPRATROPIUM-ALBUTEROL 0.5-2.5 (3) MG/3ML IN SOLN
3.0000 mL | Freq: Once | RESPIRATORY_TRACT | Status: AC
  Administered 2019-05-20: 3 mL via RESPIRATORY_TRACT
  Filled 2019-05-20: qty 3

## 2019-05-20 MED ORDER — DEXAMETHASONE SODIUM PHOSPHATE 10 MG/ML IJ SOLN
10.0000 mg | Freq: Once | INTRAMUSCULAR | Status: DC
  Filled 2019-05-20: qty 1

## 2019-05-20 MED ORDER — DEXAMETHASONE SODIUM PHOSPHATE 10 MG/ML IJ SOLN
10.0000 mg | Freq: Once | INTRAMUSCULAR | Status: AC
  Administered 2019-05-20: 10 mg via INTRAVENOUS

## 2019-05-20 MED ORDER — IOHEXOL 300 MG/ML  SOLN
75.0000 mL | Freq: Once | INTRAMUSCULAR | Status: AC | PRN
  Administered 2019-05-20: 75 mL via INTRAVENOUS
  Filled 2019-05-20: qty 75

## 2019-05-20 NOTE — Telephone Encounter (Signed)
Noted and agree with plan.

## 2019-05-20 NOTE — ED Provider Notes (Signed)
[facility] Emergency Department Provider Note   ____________________________________________    I have reviewed the triage vital signs and the nursing notes.   HISTORY  Chief Complaint Shortness of Breath     HPI Stacy Meyer is a 58 y.o. female who presents with complaints of shortness of breath.  Patient reports over the last 2 to 3 days she has has tightness in her chest and wheezing.  Does have a distant history of asthma but reports she has not had an exacerbation in some time.  She does not smoke.  No fevers or chills.  No cough.  She has taken albuterol at home with some improvement.  No exposure to coronavirus patients.  No nausea or vomiting.  Currently feels well at rest.   Past Medical History:  Diagnosis Date  . Anemia 02/2009  . Asthma   . Blood transfusion 2011   [place]  . Hypertension   . Morbid obesity [facility])     Patient Active Problem List   Diagnosis Date Noted  . Exposure to COVID-19 virus 01/05/2019  . Epigastric pain 01/05/2018  . Preventative health care 07/03/2017  . GERD (gastroesophageal reflux disease) 07/03/2017  . Hyperlipidemia 07/03/2017  . Prediabetes 02/16/2017  . Pedal edema 01/06/2013  . Chronic pain of right knee 01/06/2013  . Morbid obesity ([place])   . Mild intermittent asthma   . Allergic rhinitis 03/22/2010  . Hypertension, essential 03/22/2010    Past Surgical History:  Procedure Laterality Date  . ABDOMINAL HYSTERECTOMY    . CARPAL TUNNEL RELEASE    . COLONOSCOPY WITH PROPOFOL N/A 10/10/2016   Procedure: COLONOSCOPY WITH PROPOFOL;  Surgeon: Jonathon Bellows, MD;  Location: [facility] END[facility]COPY;  Service: Endoscopy;  Laterality: N/A;  . EXCISION OF GANGLION CYST    . TUBAL LIGATION  1988  . VAGINAL HYSTERECTOMY  12/01/2011   Procedure: HYSTERECTOMY VAGINAL;  Surgeon: Terrance Mass, MD;  Location: [facility];  Service: Gynecology;  Laterality: Right;    Prior to Admission medications   Medication Sig  Start Date End Date Taking? Authorizing Provider  albuterol (PROAIR HFA) 108 (90 Base) MCG/ACT inhaler Inhale 2 puffs into the lungs every 6 (six) hours as needed. For wheezing. 08/17/18   [name], NP  Fluticasone-Salmeterol (ADVAIR DISKUS) 100-50 MCG/DOSE AEPB INHALE ONE DOSE BY MOUTH EVERY 12 HOURS 08/17/18   [name], NP     Allergies Patient has no known allergies.  Family History  Problem Relation Age of Onset  . Breast cancer Mother   . Diabetes Father   . Hypertension Father   . Heart disease Father   . Ovarian cancer Sister   . Cancer Brother        Oral  . Colon cancer Brother     Social History Social History   Tobacco Use  . Smoking status: Never Smoker  . Smokeless tobacco: Never Used  Substance Use Topics  . Alcohol use: Yes    Comment: rarely  . Drug use: No    Review of Systems  Constitutional: No fever/chills Eyes: No visual changes.  ENT: No sore throat. Cardiovascular: Denies chest pain. Respiratory: As above Gastrointestinal: No abdominal pain.  No nausea, no vomiting.   Genitourinary: Negative for dysuria. Musculoskeletal: Negative for back pain. Skin: Negative for rash. Neurological: Negative for headaches   ____________________________________________   PHYSICAL EXAM:  VITAL SIGNS: ED Triage Vitals  Enc Vitals Group     BP 05/20/19 1113 (!) 165/88  Pulse Rate 05/20/19 1113 77     Resp 05/20/19 1113 18     Temp 05/20/19 1113 98.9 F (37.2 C)     Temp Source 05/20/19 1113 Oral     SpO2 05/20/19 1113 100 %     Weight 05/20/19 1119 (!) 162.4 kg (358 lb)     Height 05/20/19 1119 1.626 m (5\' 4" )     Head Circumference --      Peak Flow --      Pain Score 05/20/19 1118 0     Pain Loc --      Pain Edu? --      Excl. in [place]? --     Constitutional: Alert and oriented. Eyes: Conjunctivae are normal.   Nose: No congestion/rhinnorhea. Mouth/Throat: Mucous membranes are moist.    Cardiovascular: Normal rate,  regular rhythm. Grossly normal heart sounds.  Good peripheral circulation. Respiratory: Normal respiratory effort.  No retractions.  Scattered wheezing Gastrointestinal: Soft and nontender. No distention.  No CVA tenderness. Genitourinary: deferred Musculoskeletal: No lower extremity tenderness nor edema.  Warm and well perfused Neurologic:  Normal speech and language. No gross focal neurologic deficits are appreciated.  Skin:  Skin is warm, dry and intact. No rash noted. Psychiatric: Mood and affect are normal. Speech and behavior are normal.  ____________________________________________   LABS (all labs ordered are listed, but only abnormal results are displayed)  Labs Reviewed  COMPREHENSIVE METABOLIC PANEL - Abnormal; Notable for the following components:      Result Value   Glucose, Bld 108 (*)    Calcium 8.7 (*)    All other components within normal limits  CBC WITH DIFFERENTIAL/PLATELET  BRAIN NATRIURETIC PEPTIDE   ____________________________________________  EKG  ED ECG REPORT I, Lavonia Drafts, the attending physician, personally viewed and interpreted this ECG.  Date: 05/20/2019  Rhythm: normal sinus rhythm QRS Axis: normal Intervals: normal ST/T Wave abnormalities: normal Narrative Interpretation: no evidence of acute ischemia  ____________________________________________  RADIOLOGY  Chest x-ray possible mass otherwise unremarkable\ CT scan unremarkable ____________________________________________   PROCEDURES  Procedure(s) performed: No  Procedures   Critical Care performed: No ____________________________________________   INITIAL IMPRESSION / ASSESSMENT AND PLAN / ED COURSE  Pertinent labs & imaging results that were available during my care of the patient were reviewed by me and considered in my medical decision making (see chart for details).  Patient resents with some tightness in her chest, wheezing with history of asthma.  She does  have scattered wheezes on exam.  Will treat with duo nebs, Decadron.  Chest x-ray shows possible mass, discussed with her and we will obtain CT to evaluate this further  CT scan is quite reassuring.  After treatment she reports she feels great.  Strongly encouraged her to return if any worsening of symptoms, use albuterol inhalers at home every 6 hours as needed    ____________________________________________   FINAL CLINICAL IMPRESSION(S) / ED DIAGNOSES  Final diagnoses:  Bronchospasm        Note:  This document was prepared using Dragon voice recognition software and may include unintentional dictation errors.   Lavonia Drafts, MD 05/20/19 [phone]

## 2019-05-20 NOTE — ED Triage Notes (Signed)
Patient reports increased in exertional shortness of breath since Wednesday. Patient states she has asthma and has used her inhalers. Patient c/o non-productive cough that is intermittent. Patient c/o mid-sternal tightness.

## 2019-05-20 NOTE — ED Triage Notes (Signed)
FIRST NURSE NOTE-here for CP/SHOB X 3 days. NAD at check in, pulled for EKG.

## 2019-05-20 NOTE — Telephone Encounter (Signed)
Pt has hx of asthma but since 05/18/19 pt having SOB that the inhaler is not helping. 05/19/19 pt felt tight in chest with breathing but also had rt arm pain at same time. Pt said now upon any exertion has chest tightness, but no arm pain today. Pt has had wheezing, pt is chilling with no fever.T 98.7 Last night pt had pain and pressure feeling behind rt eye like her eye was going to "explode" but eye is not bothering her today. Pt has non prod cough, fatigue and pt has someone to take her to [facility] now. FYI to Gentry Fitz NP.

## 2019-08-02 ENCOUNTER — Other Ambulatory Visit: Payer: Self-pay | Admitting: Primary Care

## 2019-08-02 DIAGNOSIS — J4520 Mild intermittent asthma, uncomplicated: Secondary | ICD-10-CM

## 2020-01-17 ENCOUNTER — Ambulatory Visit (INDEPENDENT_AMBULATORY_CARE_PROVIDER_SITE_OTHER): Payer: No Typology Code available for payment source | Admitting: Primary Care

## 2020-01-17 ENCOUNTER — Other Ambulatory Visit: Payer: Self-pay

## 2020-01-17 ENCOUNTER — Encounter: Payer: Self-pay | Admitting: Primary Care

## 2020-01-17 VITALS — BP 142/82 | HR 78 | Temp 97.6°F | Ht 63.75 in | Wt 360.5 lb

## 2020-01-17 DIAGNOSIS — I10 Essential (primary) hypertension: Secondary | ICD-10-CM | POA: Diagnosis not present

## 2020-01-17 DIAGNOSIS — K219 Gastro-esophageal reflux disease without esophagitis: Secondary | ICD-10-CM

## 2020-01-17 DIAGNOSIS — E785 Hyperlipidemia, unspecified: Secondary | ICD-10-CM

## 2020-01-17 DIAGNOSIS — Z1159 Encounter for screening for other viral diseases: Secondary | ICD-10-CM

## 2020-01-17 DIAGNOSIS — Z114 Encounter for screening for human immunodeficiency virus [HIV]: Secondary | ICD-10-CM

## 2020-01-17 DIAGNOSIS — N6320 Unspecified lump in the left breast, unspecified quadrant: Secondary | ICD-10-CM | POA: Diagnosis not present

## 2020-01-17 DIAGNOSIS — R7303 Prediabetes: Secondary | ICD-10-CM

## 2020-01-17 DIAGNOSIS — Z0000 Encounter for general adult medical examination without abnormal findings: Secondary | ICD-10-CM | POA: Diagnosis not present

## 2020-01-17 DIAGNOSIS — J4520 Mild intermittent asthma, uncomplicated: Secondary | ICD-10-CM

## 2020-01-17 MED ORDER — HYDROCHLOROTHIAZIDE 12.5 MG PO TABS: 12.5000 mg | ORAL_TABLET | Freq: Every day | ORAL | 0 refills | Status: DC

## 2020-01-17 NOTE — Patient Instructions (Signed)
Stop by the lab prior to leaving today. I will notify you of your results once received.   Call the breast center to schedule your mammogram.  Start hydrochlorothiazide 12.5 mg once daily for blood pressure.  Start monitoring your blood pressure daily, around the same time of day, for the next 2-3 weeks.  Ensure that you have rested for 30 minutes prior to checking your blood pressure. Record your readings and bring them to your next visit.  Be sure to eat a healthy diet. Ensure you are consuming 64 ounces of water daily.  Schedule a follow up visit for 2-3 weeks for blood pressure check.  It was a pleasure to see you today!   Preventive Care 57-43 Years Old, Female Preventive care refers to visits with your health care provider and lifestyle choices that can promote health and wellness. This includes:  A yearly physical exam. This may also be called an annual well check.  Regular dental visits and eye exams.  Immunizations.  Screening for certain conditions.  Healthy lifestyle choices, such as eating a healthy diet, getting regular exercise, not using drugs or products that contain nicotine and tobacco, and limiting alcohol use. What can I expect for my preventive care visit? Physical exam Your health care provider will check your:  Height and weight. This may be used to calculate body mass index (BMI), which tells if you are at a healthy weight.  Heart rate and blood pressure.  Skin for abnormal spots. Counseling Your health care provider may ask you questions about your:  Alcohol, tobacco, and drug use.  Emotional well-being.  Home and relationship well-being.  Sexual activity.  Eating habits.  Work and work Statistician.  Method of birth control.  Menstrual cycle.  Pregnancy history. What immunizations do I need?  Influenza (flu) vaccine  This is recommended every year. Tetanus, diphtheria, and pertussis (Tdap) vaccine  You may need a Td booster every  10 years. Varicella (chickenpox) vaccine  You may need this if you have not been vaccinated. Zoster (shingles) vaccine  You may need this after age 57. Measles, mumps, and rubella (MMR) vaccine  You may need at least one dose of MMR if you were born in 1957 or later. You may also need a second dose. Pneumococcal conjugate (PCV13) vaccine  You may need this if you have certain conditions and were not previously vaccinated. Pneumococcal polysaccharide (PPSV23) vaccine  You may need one or two doses if you smoke cigarettes or if you have certain conditions. Meningococcal conjugate (MenACWY) vaccine  You may need this if you have certain conditions. Hepatitis A vaccine  You may need this if you have certain conditions or if you travel or work in places where you may be exposed to hepatitis A. Hepatitis B vaccine  You may need this if you have certain conditions or if you travel or work in places where you may be exposed to hepatitis B. Haemophilus influenzae type b (Hib) vaccine  You may need this if you have certain conditions. Human papillomavirus (HPV) vaccine  If recommended by your health care provider, you may need three doses over 6 months. You may receive vaccines as individual doses or as more than one vaccine together in one shot (combination vaccines). Talk with your health care provider about the risks and benefits of combination vaccines. What tests do I need? Blood tests  Lipid and cholesterol levels. These may be checked every 5 years, or more frequently if you are over 50 years  old.  Hepatitis C test.  Hepatitis B test. Screening  Lung cancer screening. You may have this screening every year starting at age 55 if you have a 30-pack-year history of smoking and currently smoke or have quit within the past 15 years.  Colorectal cancer screening. All adults should have this screening starting at age 42 and continuing until age 67. Your health care provider may  recommend screening at age 29 if you are at increased risk. You will have tests every 1-10 years, depending on your results and the type of screening test.  Diabetes screening. This is done by checking your blood sugar (glucose) after you have not eaten for a while (fasting). You may have this done every 1-3 years.  Mammogram. This may be done every 1-2 years. Talk with your health care provider about when you should start having regular mammograms. This may depend on whether you have a family history of breast cancer.  BRCA-related cancer screening. This may be done if you have a family history of breast, ovarian, tubal, or peritoneal cancers.  Pelvic exam and Pap test. This may be done every 3 years starting at age 30. Starting at age 48, this may be done every 5 years if you have a Pap test in combination with an HPV test. Other tests  Sexually transmitted disease (STD) testing.  Bone density scan. This is done to screen for osteoporosis. You may have this scan if you are at high risk for osteoporosis. Follow these instructions at home: Eating and drinking  Eat a diet that includes fresh fruits and vegetables, whole grains, lean protein, and low-fat dairy.  Take vitamin and mineral supplements as recommended by your health care provider.  Do not drink alcohol if: ? Your health care provider tells you not to drink. ? You are pregnant, may be pregnant, or are planning to become pregnant.  If you drink alcohol: ? Limit how much you have to 0-1 drink a day. ? Be aware of how much alcohol is in your drink. In the [place] one drink equals one 12 oz bottle of beer (355 mL), one 5 oz glass of wine (148 mL), or one 1 oz glass of hard liquor (44 mL). Lifestyle  Take daily care of your teeth and gums.  Stay active. Exercise for at least 30 minutes on 5 or more days each week.  Do not use any products that contain nicotine or tobacco, such as cigarettes, e-cigarettes, and chewing tobacco. If  you need help quitting, ask your health care provider.  If you are sexually active, practice safe sex. Use a condom or other form of birth control (contraception) in order to prevent pregnancy and STIs (sexually transmitted infections).  If told by your health care provider, take low-dose aspirin daily starting at age 63. What's next?  Visit your health care provider once a year for a well check visit.  Ask your health care provider how often you should have your eyes and teeth checked.  Stay up to date on all vaccines. This information is not intended to replace advice given to you by your health care provider. Make sure you discuss any questions you have with your health care provider. Document Revised: 04/01/2018 Document Reviewed: 04/01/2018 Elsevier Patient Education  2020 [employer].

## 2020-01-17 NOTE — Progress Notes (Signed)
Subjective:    Patient ID: Stacy Meyer, female    DOB: 1960-12-25, 59 y.o.   MRN: [id number]  HPI  This visit occurred during the SARS-CoV-2 public health emergency.  Safety protocols were in place, including screening questions prior to the visit, additional usage of staff PPE, and extensive cleaning of exam room while observing appropriate contact time as indicated for disinfecting solutions.   Stacy Meyer is a 59 year old female who presents today for complete physical.  Immunizations: -Tetanus: Completed in 2018 -Influenza: Did not completed  -Shingles: Never completes, declines today -Covid-19: Not sure yet.   Diet: She endorses a healthy diet.  Exercise: She is not exercising due to left knee pain and swelling.   Eye exam: Completed in 2021 Dental exam: No recent exam  Pap Smear: Hysterectomy  Mammogram: Completed in March 2020 Colonoscopy: Completed in March 2018 and December 2018, due again in 2023. Hep C Screen: Due  BP Readings from Last 3 Encounters:  01/17/20 (!) 142/82  05/20/19 (!) 165/88  01/05/19 (!) 141/92   She has checked her BP at home over the last several days which is running 147/78, 156/?, 163/?Marland Kitchen She denies chest pain, shortness of breath, dizziness. She has a family history of hypertension in her father and siblings.   Review of Systems  Constitutional: Negative for unexpected weight change.  HENT: Negative for rhinorrhea.   Eyes: Negative for visual disturbance.  Respiratory: Negative for cough and shortness of breath.   Cardiovascular: Negative for chest pain.  Gastrointestinal: Negative for constipation and diarrhea.  Genitourinary: Negative for difficulty urinating.  Musculoskeletal: Positive for arthralgias. Negative for myalgias.  Skin: Negative for rash.  Allergic/Immunologic: Negative for environmental allergies.  Neurological: Negative for dizziness and headaches.  Psychiatric/Behavioral: The patient is not nervous/anxious.         Past Medical History:  Diagnosis Date   Anemia 02/2009   Asthma    Blood transfusion 2011   [place]   Hypertension    Morbid obesity ([place])      Social History   Socioeconomic History   Marital status: Single    Spouse name: Not on file   Number of children: Not on file   Years of education: Not on file   Highest education level: Not on file  Occupational History   Not on file  Tobacco Use   Smoking status: Never Smoker   Smokeless tobacco: Never Used  Substance and Sexual Activity   Alcohol use: Yes    Comment: rarely   Drug use: No   Sexual activity: Never    Birth control/protection: Pill  Other Topics Concern   Not on file  Social History Narrative   Single.   2 children, 1 grandchildren.   Works in [employer], Network engineer job.   Enjoys relaxing.   Social Determinants of Health   Financial Resource Strain:    Difficulty of Paying Living Expenses:   Food Insecurity:    Worried About Charity fundraiser in the Last Year:    Arboriculturist in the Last Year:   Transportation Needs:    Film/video editor (Medical):    Lack of Transportation (Non-Medical):   Physical Activity:    Days of Exercise per Week:    Minutes of Exercise per Session:   Stress:    Feeling of Stress :   Social Connections:    Frequency of Communication with Friends and Family:    Frequency of Social Gatherings  with Friends and Family:    Attends Religious Services:    Active Member of Clubs or Organizations:    Attends Music therapist:    Marital Status:   Intimate Partner Violence:    Fear of Current or Ex-Partner:    Emotionally Abused:    Physically Abused:    Sexually Abused:     Past Surgical History:  Procedure Laterality Date   ABDOMINAL HYSTERECTOMY     CARPAL TUNNEL RELEASE     COLONOSCOPY WITH PROPOFOL N/A 10/10/2016   Procedure: COLONOSCOPY WITH PROPOFOL;  Surgeon: Jonathon Bellows, MD;  Location: [facility];   Service: Endoscopy;  Laterality: N/A;   EXCISION OF GANGLION CYST     TUBAL LIGATION  1988   VAGINAL HYSTERECTOMY  12/01/2011   Procedure: HYSTERECTOMY VAGINAL;  Surgeon: Terrance Mass, MD;  Location: [facility];  Service: Gynecology;  Laterality: Right;    Family History  Problem Relation Age of Onset   Breast cancer Mother    Diabetes Father    Hypertension Father    Heart disease Father    Ovarian cancer Sister    Cancer Brother        Oral   Colon cancer Brother     No Known Allergies  Current Outpatient Medications on File Prior to Visit  Medication Sig Dispense Refill   albuterol (VENTOLIN HFA) 108 (90 Base) MCG/ACT inhaler INHALE 2 PUFFS BY MOUTH EVERY 6 HOURS AS NEEDED FOR WHEEZING 8 g 0   Fluticasone-Salmeterol (ADVAIR DISKUS) 100-50 MCG/DOSE AEPB INHALE ONE DOSE BY MOUTH EVERY 12 HOURS (Patient taking differently: INHALE ONE DOSE BY MOUTH AS NEEDED) 60 each 6   No current facility-administered medications on file prior to visit.    BP (!) 142/82    Pulse 78    Temp 97.6 F (36.4 C) (Temporal)    Ht 5' 3.75" (1.619 m)    Wt (!) 360 lb 8 oz (163.5 kg)    LMP 09/20/2011    SpO2 95%    BMI 62.37 kg/m    Objective:   Physical Exam  Constitutional: She is oriented to person, place, and time.  HENT:  Right Ear: Tympanic membrane and ear canal normal.  Left Ear: Tympanic membrane and ear canal normal.  Eyes: Pupils are equal, round, and reactive to light.  Cardiovascular: Normal rate and regular rhythm.  Respiratory: Effort normal and breath sounds normal.  GI: Soft. Bowel sounds are normal. There is no abdominal tenderness.  Musculoskeletal:        General: Normal range of motion.     Cervical back: Neck supple.  Neurological: She is alert and oriented to person, place, and time. No cranial nerve deficit.  Reflex Scores:      Patellar reflexes are 2+ on the right side and 2+ on the left side. Skin: Skin is warm and dry.  Psychiatric: Mood normal.            Assessment & Plan:

## 2020-01-17 NOTE — Assessment & Plan Note (Signed)
Denies concerns for esophageal reflux, only intermittent symptoms with certain foods.

## 2020-01-17 NOTE — Assessment & Plan Note (Signed)
No recent A1c on file, repeat A1c pending.

## 2020-01-17 NOTE — Assessment & Plan Note (Signed)
Asymptomatic, no wheezing on exam.  Hardly ever uses Advair or albuterol.  Discussed use albuterol for short-term need, Advair if symptoms of asthma return on a recurrent basis.

## 2020-01-17 NOTE — Assessment & Plan Note (Signed)
Tetanus up-to-date.  Declines shingles vaccines.  Does not plan on getting Covid vaccines at this time. Mammogram overdue, diagnostic mammogram and ultrasound ordered as indicated in report from March 2020. Colonoscopy up-to-date, due in 2023, see care everywhere.  Strongly advise she work on weight loss through diet and exercise. Exam today stable. Labs pending.

## 2020-01-17 NOTE — Assessment & Plan Note (Signed)
Above goal in the office today, also during prior visits, also recently with home readings.  Long discussion today regarding dangers of uncontrolled hypertension.  She will work on lifestyle changes, also agrees to low-dose treatment for now.  Prescription for hydrochlorothiazide 12.5 mg sent to pharmacy.  We will plan to see her back in 2 to 3 weeks for blood pressure check and repeat BMP.

## 2020-01-17 NOTE — Assessment & Plan Note (Signed)
Repeat lipid panel pending.  Not currently on treatment. 

## 2020-01-18 LAB — LIPID PANEL
Cholesterol: 196 mg/dL (ref 0–200)
HDL: 62.2 mg/dL (ref >39.00)
LDL Cholesterol: 124 mg/dL — ABNORMAL HIGH (ref 0–99)
NonHDL: 133.43
Total CHOL/HDL Ratio: 3
Triglycerides: 47 mg/dL (ref 0.0–149.0)
VLDL: 9.4 mg/dL (ref 0.0–40.0)

## 2020-01-18 LAB — HEMOGLOBIN A1C: Hgb A1c MFr Bld: 6.7 % — ABNORMAL HIGH (ref 4.6–6.5)

## 2020-01-18 LAB — CBC
HCT: 38.6 % (ref 36.0–46.0)
Hemoglobin: 12.6 g/dL (ref 12.0–15.0)
MCHC: 32.5 g/dL (ref 30.0–36.0)
MCV: 84.9 fl (ref 78.0–100.0)
Platelets: 314 10*3/uL (ref 150.0–400.0)
RBC: 4.55 Mil/uL (ref 3.87–5.11)
RDW: 15 % (ref 11.5–15.5)
WBC: 5.8 10*3/uL (ref 4.0–10.5)

## 2020-01-18 LAB — COMPREHENSIVE METABOLIC PANEL
ALT: 8 U/L (ref 0–35)
AST: 15 U/L (ref 0–37)
Albumin: 3.7 g/dL (ref 3.5–5.2)
Alkaline Phosphatase: 65 U/L (ref 39–117)
BUN: 15 mg/dL (ref 6–23)
CO2: 31 mEq/L (ref 19–32)
Calcium: 8.9 mg/dL (ref 8.4–10.5)
Chloride: 105 mEq/L (ref 96–112)
Creatinine, Ser: 0.99 mg/dL (ref 0.40–1.20)
GFR: 70.02 mL/min (ref >60.00)
Glucose, Bld: 95 mg/dL (ref 70–99)
Potassium: 3.8 mEq/L (ref 3.5–5.1)
Sodium: 142 mEq/L (ref 135–145)
Total Bilirubin: 0.5 mg/dL (ref 0.2–1.2)
Total Protein: 6.3 g/dL (ref 6.0–8.3)

## 2020-01-18 LAB — HEPATITIS C ANTIBODY
Hepatitis C Ab: NONREACTIVE
SIGNAL TO CUT-OFF: 0.01 (ref <1.00)

## 2020-01-18 LAB — HIV ANTIBODY (ROUTINE TESTING W REFLEX): HIV 1&2 Ab, 4th Generation: NONREACTIVE

## 2020-01-19 ENCOUNTER — Telehealth: Payer: Self-pay

## 2020-01-19 DIAGNOSIS — E119 Type 2 diabetes mellitus without complications: Secondary | ICD-10-CM

## 2020-01-19 MED ORDER — METFORMIN HCL ER 500 MG PO TB24: 500.0000 mg | ORAL_TABLET | Freq: Every day | ORAL | 0 refills | Status: DC

## 2020-01-19 NOTE — Telephone Encounter (Signed)
I spoke with Gentry Fitz NP and she advised to tell pt to continue rest, drinking fluids and continue to monitor BP and call on 01/23/20 with BP readings; I advised pt she did not have to take BP every hour; possibly take when gets up, late afternoon or evening and if has H/A,CP,SOB or dizziness. Pt voiced understanding. Also pt did read the my chart message about new onset of DM2. Pt does not know how to respond on mychart. Pt said she is OK trying metformin but pt thought she had seen on news about taking metformin off the market; pt is fearful of kidney failure. Pt will have to cb to schedule 2-3 wk FU appt after checking her granddaughters pick up schedule. Pt will cb on Mon with BP readings. Pt request cb about whether Gentry Fitz NP is going to start pt on metformin or something else and pt said she would answer phone.  [facility] [address] UC & ED precautions given and pt voiced understanding.

## 2020-01-19 NOTE — Telephone Encounter (Signed)
Noted, Rx for metformin sent to pharmacy.

## 2020-01-19 NOTE — Telephone Encounter (Signed)
Please thank patient for the update, reassure her that it may take several days for the blood pressure medicine to take effect.  Take blood pressure once daily.  I recommend Metformin, and will not cause kidney damage.  Various manufacturers of metformin were pulled from the market last year due to contaminants of cancerous agents.  This was corrected last year.  I strongly recommend Metformin for diabetes treatment as it is cost effective and works very well.  Please let me know if she is agreeable to starting this once daily with breakfast.  We will see her in 2 to 3 weeks for blood pressure check.

## 2020-01-19 NOTE — Telephone Encounter (Signed)
Pt said she was seen on 01/17/20 for annual and was started on HCTZ 12.5 mg daily. Pt picked up med yesterday and did not start HCTZ 12.5 mg until today at 6 AM. Pt said at 4 AM got up to get ready for work. Pt had h/a and took BP 179/103 P 70. At 6 AM pt recked BP 184/102 P ? And rechecked BP at 10 AM BP 167/103 P 62. Pt still has H/A at pain level 2. No CP,SOB, dizziness; pt has no pain, anxiety or stress that would elevate BP. Pt does not eat salt. Pt has been laying down and resting yesterday and today. Advised pt to continue to rest; drink plenty of fluids and someone will cb her back with further instruction. Pt is not in any distress. UC & ED precautions given and pt voiced understanding. [place]

## 2020-01-19 NOTE — Telephone Encounter (Signed)
Spoken and notified patient of Stacy Meyer comments. Verbalized understanding.  Patient is agreeable to the Metformin, please sent to [place].  Patient stated that she will have check work first before she can schedule a follow up

## 2020-01-20 ENCOUNTER — Telehealth: Payer: Self-pay

## 2020-01-20 MED ORDER — CONTOUR NEXT TEST VI STRP: ORAL_STRIP | 12 refills | Status: AC

## 2020-01-20 MED ORDER — LANCET DEVICE MISC: 1.0000 | Freq: Once | 0 refills | Status: AC

## 2020-01-20 MED ORDER — LANCETS MISC: 1.0000 | Freq: Every day | 3 refills | Status: AC

## 2020-01-20 MED ORDER — CONTOUR NEXT MONITOR W/DEVICE KIT: 1.0000 | PACK | Freq: Once | 0 refills | Status: AC

## 2020-01-20 NOTE — Telephone Encounter (Signed)
Pt left another VM stating [employer] told her we had to call [phone] to find out what supplies she gets from insurance.   I called her back and she said they would not tell her what she could use.  I looked up several different [employer] formularies and found consistently [employer] is covered. I will send that in.

## 2020-01-20 NOTE — Telephone Encounter (Signed)
Pt requesting new diabetic meter, strips and lancets. Pt will contact her ins. Co to see which meter, strips and lancets her ins co will approve and pt will cb. FYI to [place].

## 2020-01-23 ENCOUNTER — Telehealth: Payer: Self-pay | Admitting: *Deleted

## 2020-01-23 NOTE — Telephone Encounter (Signed)
Spoken and notified patient of Stacy Meyer comments. Verbalized understanding.  Also patient stated that she needs a note for work because she did not go to work on Wed 6/16, Thurs 6/17, Fri 6/18, and Mon 6/21. She would like to go to work on [employer] 6/22

## 2020-01-23 NOTE — Telephone Encounter (Signed)
Spoken and notified patient of Kate Clark's comments. Verbalized understanding. ° °

## 2020-01-23 NOTE — Telephone Encounter (Signed)
Noted, printed and placed up front for pick up. Will you notify patient?

## 2020-01-23 NOTE — Telephone Encounter (Signed)
Please thank patient for BP readings. Overall it seems like the top number is reducing. It may take another week for her to see further/continued decreases.   Have her continue to monitor and lets see her back on 02/03/20 as scheduled. If no further decreases after one week (June 28th), then have her call me.

## 2020-01-23 NOTE — Telephone Encounter (Signed)
Patient called stating that she was to call today with blood pressure readings. Patient stated her blood pressure Friday am was 165/93 and at noon 150/88, Saturday 8 am 135/93 and 2:00 pm  136/101, Sunday 144/95. Patient stated today her blood pressure was 145/97.

## 2020-02-03 ENCOUNTER — Ambulatory Visit: Payer: No Typology Code available for payment source | Admitting: Primary Care

## 2020-02-03 ENCOUNTER — Encounter: Payer: Self-pay | Admitting: Primary Care

## 2020-02-03 ENCOUNTER — Other Ambulatory Visit: Payer: Self-pay

## 2020-02-03 VITALS — BP 130/80 | HR 80 | Temp 96.7°F | Ht 63.75 in | Wt 357.5 lb

## 2020-02-03 DIAGNOSIS — E119 Type 2 diabetes mellitus without complications: Secondary | ICD-10-CM

## 2020-02-03 DIAGNOSIS — I10 Essential (primary) hypertension: Secondary | ICD-10-CM | POA: Diagnosis not present

## 2020-02-03 DIAGNOSIS — E785 Hyperlipidemia, unspecified: Secondary | ICD-10-CM

## 2020-02-03 MED ORDER — LOSARTAN POTASSIUM 50 MG PO TABS: 50.0000 mg | ORAL_TABLET | Freq: Every day | ORAL | 0 refills | Status: DC

## 2020-02-03 NOTE — Assessment & Plan Note (Signed)
LDL above goal, especially in the setting of diabetes. I recommended statin therapy, explained her risk for CVD and she kindly declines.  Will re-address at next visit.

## 2020-02-03 NOTE — Patient Instructions (Addendum)
Stop hydrochlorothiazide 12.5 mg for blood pressure, start losartan 50 mg once daily for blood pressure.   Schedule a lab appointment and nurse visit for two weeks for blood pressure check and lab.  Please schedule a follow up appointment in 3 months for diabetes check.  It was a pleasure to see you today!

## 2020-02-03 NOTE — Assessment & Plan Note (Signed)
New diagnosis with A1C of 6.7 last visit. Initiated on Metformin XR 500 mg and is compliant.  Foot exam today. Recommended statin therapy for which she declines despite recommendations.  Pneumonia vaccination declined today.   Follow up in 3 months.

## 2020-02-03 NOTE — Progress Notes (Signed)
Subjective:    Patient ID: Stacy Meyer, female    DOB: 1961/04/14, 59 y.o.   MRN: [id number]  HPI  This visit occurred during the SARS-CoV-2 public health emergency.  Safety protocols were in place, including screening questions prior to the visit, additional usage of staff PPE, and extensive cleaning of exam room while observing appropriate contact time as indicated for disinfecting solutions.   Stacy Meyer is a 59 year old female with a history of hypertension, asthma, GERD, pedal edema, type 2 diabetes who presents today for follow up of hypertension.  She was last evaluated on 01/17/20 for her CPE, labs consistent for type 2 diabetes which was a new diagnosis, also cholesterol was above goal. She was also noted to have elevated blood pressure reading that day as well as prior office visits.   During her last visit we initiated treatment for hypertension with HCTZ 12.5 mg and asked her to return today for follow up.  Since her last visit she was initiated on Metformin XR 500 mg daily for diabetes. We will need to discuss hyperlipidemia and statin therapy today. She endorses compliance to her HCTZ 12.5 mg tablets.   BP Readings from Last 3 Encounters:  02/03/20 130/80  01/17/20 (!) 142/82  05/20/19 (!) 165/88     Review of Systems  Eyes: Negative for visual disturbance.  Respiratory: Negative for shortness of breath.   Cardiovascular: Negative for chest pain.  Neurological: Negative for dizziness and headaches.       Past Medical History:  Diagnosis Date  . Anemia 02/2009  . Asthma   . Blood transfusion 2011     . Hypertension   . Morbid obesity ([place])      Social History   Socioeconomic History  . Marital status: Single    Spouse name: Not on file  . Number of children: Not on file  . Years of education: Not on file  . Highest education level: Not on file  Occupational History  . Not on file  Tobacco Use  . Smoking status: Never Smoker  .  Smokeless tobacco: Never Used  Substance and Sexual Activity  . Alcohol use: Yes    Comment: rarely  . Drug use: No  . Sexual activity: Never    Birth control/protection: Pill  Other Topics Concern  . Not on file  Social History Narrative   Single.   2 children, 1 grandchildren.   Works in [employer], Network engineer job.   Enjoys relaxing.   Social Determinants of Health   Financial Resource Strain:   . Difficulty of Paying Living Expenses:   Food Insecurity:   . Worried About Charity fundraiser in the Last Year:   . Arboriculturist in the Last Year:   Transportation Needs:   . Film/video editor (Medical):   Marland Kitchen Lack of Transportation (Non-Medical):   Physical Activity:   . Days of Exercise per Week:   . Minutes of Exercise per Session:   Stress:   . Feeling of Stress :   Social Connections:   . Frequency of Communication with Friends and Family:   . Frequency of Social Gatherings with Friends and Family:   . Attends Religious Services:   . Active Member of Clubs or Organizations:   . Attends Archivist Meetings:   Marland Kitchen Marital Status:   Intimate Partner Violence:   . Fear of Current or Ex-Partner:   . Emotionally Abused:   Marland Kitchen Physically Abused:   .  Sexually Abused:     Past Surgical History:  Procedure Laterality Date  . ABDOMINAL HYSTERECTOMY    . CARPAL TUNNEL RELEASE    . COLONOSCOPY WITH PROPOFOL N/A 10/10/2016   Procedure: COLONOSCOPY WITH PROPOFOL;  Surgeon: Jonathon Bellows, MD;  Location: [facility];  Service: Endoscopy;  Laterality: N/A;  . EXCISION OF GANGLION CYST    . TUBAL LIGATION  1988  . VAGINAL HYSTERECTOMY  12/01/2011   Procedure: HYSTERECTOMY VAGINAL;  Surgeon: Terrance Mass, MD;  Location: [facility];  Service: Gynecology;  Laterality: Right;    Family History  Problem Relation Age of Onset  . Breast cancer Mother   . Diabetes Father   . Hypertension Father   . Heart disease Father   . Ovarian cancer Sister   . Cancer Brother        Oral    . Colon cancer Brother     No Known Allergies  Current Outpatient Medications on File Prior to Visit  Medication Sig Dispense Refill  . albuterol (VENTOLIN HFA) 108 (90 Base) MCG/ACT inhaler INHALE 2 PUFFS BY MOUTH EVERY 6 HOURS AS NEEDED FOR WHEEZING 8 g 0  . Fluticasone-Salmeterol (ADVAIR DISKUS) 100-50 MCG/DOSE AEPB INHALE ONE DOSE BY MOUTH EVERY 12 HOURS (Patient taking differently: INHALE ONE DOSE BY MOUTH AS NEEDED) 60 each 6  . glucose blood (CONTOUR NEXT TEST) test strip Use to check blood sugar once a day 100 each 12  . Lancets MISC 1 each by Does not apply route daily. Use to obtain blood sugar sample once a day 100 each 3  . metFORMIN (GLUCOPHAGE XR) 500 MG 24 hr tablet Take 1 tablet (500 mg total) by mouth daily with breakfast. For diabetes. 90 tablet 0   No current facility-administered medications on file prior to visit.    BP 130/80   Pulse 80   Temp (!) 96.7 F (35.9 C) (Temporal)   Ht 5' 3.75" (1.619 m)   Wt (!) 357 lb 8 oz (162.2 kg)   LMP 09/20/2011   SpO2 98%   BMI 61.85 kg/m    Objective:   Physical Exam Cardiovascular:     Rate and Rhythm: Normal rate and regular rhythm.  Pulmonary:     Effort: Pulmonary effort is normal.     Breath sounds: Normal breath sounds.  Musculoskeletal:     Cervical back: Neck supple.  Skin:    General: Skin is warm and dry.            Assessment & Plan:

## 2020-02-03 NOTE — Assessment & Plan Note (Signed)
Improved on HCTZ but she cannot tolerate the diuresis.  Will switch to losartan 50 mg given side effects from HCTZ and also for renal protection against diabetes.   Check BMP and BP in 2 weeks.

## 2020-02-09 ENCOUNTER — Other Ambulatory Visit: Payer: Self-pay

## 2020-02-09 ENCOUNTER — Ambulatory Visit: Payer: No Typology Code available for payment source

## 2020-02-09 ENCOUNTER — Ambulatory Visit
Admission: RE | Admit: 2020-02-09 | Discharge: 2020-02-09 | Disposition: A | Discharge status: Home / Self Care | Payer: No Typology Code available for payment source | Source: Ambulatory Visit | Attending: Primary Care | Admitting: Primary Care

## 2020-02-09 DIAGNOSIS — N6320 Unspecified lump in the left breast, unspecified quadrant: Secondary | ICD-10-CM

## 2020-02-17 ENCOUNTER — Ambulatory Visit: Payer: No Typology Code available for payment source

## 2020-02-17 ENCOUNTER — Other Ambulatory Visit (INDEPENDENT_AMBULATORY_CARE_PROVIDER_SITE_OTHER): Payer: No Typology Code available for payment source

## 2020-02-17 DIAGNOSIS — I10 Essential (primary) hypertension: Secondary | ICD-10-CM

## 2020-02-17 NOTE — Addendum Note (Signed)
Addended by: Cloyd Stagers on: 02/17/2020 03:54 PM   Modules accepted: Orders

## 2020-02-18 LAB — BASIC METABOLIC PANEL
BUN/Creatinine Ratio: 14 (calc) (ref 6–22)
BUN: 16 mg/dL (ref 7–25)
CO2: 26 mmol/L (ref 20–32)
Calcium: 8.9 mg/dL (ref 8.6–10.4)
Chloride: 106 mmol/L (ref 98–110)
Creat: 1.11 mg/dL — ABNORMAL HIGH (ref 0.50–1.05)
Glucose, Bld: 98 mg/dL (ref 65–99)
Potassium: 4.1 mmol/L (ref 3.5–5.3)
Sodium: 141 mmol/L (ref 135–146)

## 2020-03-06 ENCOUNTER — Other Ambulatory Visit: Payer: Self-pay | Admitting: Primary Care

## 2020-03-06 DIAGNOSIS — I10 Essential (primary) hypertension: Secondary | ICD-10-CM

## 2020-05-07 ENCOUNTER — Ambulatory Visit: Payer: No Typology Code available for payment source | Admitting: Primary Care

## 2020-05-08 ENCOUNTER — Telehealth: Payer: Self-pay

## 2020-05-08 NOTE — Telephone Encounter (Signed)
Pt returned your call.  

## 2020-05-08 NOTE — Telephone Encounter (Signed)
Number busy

## 2020-05-08 NOTE — Telephone Encounter (Signed)
Left message to return call to our office.  

## 2020-05-08 NOTE — Telephone Encounter (Signed)
Pt got her 1st Pfizer Covid vaccine on 05-05-20. She started having pain in her knees. Today she she is having pain in her left foot. Knee pain has gotten better. She is asking if these pains could be from the vaccine. Please advise [phone].

## 2020-05-08 NOTE — Telephone Encounter (Signed)
They could be, they could also not be. Recommend Ibuprofen 400 mg OTC 2 x day with food if she can take that. If not, Tylenol 500 mg 2 x day. If symptoms persist, she should let us know.

## 2020-05-09 NOTE — Telephone Encounter (Signed)
Left message to return call to our office.  

## 2020-05-09 NOTE — Telephone Encounter (Signed)
This all started after the Covid-19 vaccine? Joint aches are common after this vaccine. If she's not improving then she needs evaluation.

## 2020-05-09 NOTE — Telephone Encounter (Signed)
Please advise 

## 2020-05-09 NOTE — Telephone Encounter (Signed)
Patient returning call from [place]. Please advise.

## 2020-05-09 NOTE — Telephone Encounter (Signed)
Patient called back she has been taking tylenol with no help. She Is having increased pain in left foot. She is not able to put any pressure on in or she will fall. She denies any injury. Does not have swelling or redness that she can see. When she put on supportive shoe has very little improvement. She has been using icy hot but does not help much. Denies any history of gout.   She also has been having issues because she has been out of work. She has been out of work all week.

## 2020-05-09 NOTE — Telephone Encounter (Signed)
Called patient to schedule virtual visit tomorrow with Dr.Copland. Patient became upset stating "what the hell is a virtual going to do?" Explained to patient what can be done and before could offer another office visit and different location, patient stated "Nevermind I am just going to suffer and hope it goes away by the weekend." Patient then asked if PCP can write her a letter. Did not get information in regards to what letter is needing to say. Did apologize to patient but patient disconnected. Please advise.

## 2020-05-09 NOTE — Telephone Encounter (Addendum)
She will need evaluation before we can provide a letter. Did someone explain to her that joint aches can occur after the vaccine? I am happy to see her in person, if she passes COVID-19 screening, when I return next week.

## 2020-05-10 NOTE — Telephone Encounter (Signed)
I did explain that to her when we spoke yesterday. I was having trouble finding a available appointment that is why I had Alice give her a call. She was not happy with the time to get appointment. I will call and check on her later today to see if she would like to make appointment for next week.

## 2020-07-18 ENCOUNTER — Other Ambulatory Visit: Payer: Self-pay | Admitting: Primary Care

## 2020-07-18 DIAGNOSIS — I10 Essential (primary) hypertension: Secondary | ICD-10-CM

## 2020-07-18 DIAGNOSIS — E119 Type 2 diabetes mellitus without complications: Secondary | ICD-10-CM

## 2020-09-14 ENCOUNTER — Other Ambulatory Visit: Payer: Self-pay

## 2020-09-14 ENCOUNTER — Other Ambulatory Visit: Payer: Self-pay | Admitting: Family Medicine

## 2020-09-14 ENCOUNTER — Telehealth (INDEPENDENT_AMBULATORY_CARE_PROVIDER_SITE_OTHER): Payer: No Typology Code available for payment source | Admitting: Family Medicine

## 2020-09-14 ENCOUNTER — Other Ambulatory Visit (INDEPENDENT_AMBULATORY_CARE_PROVIDER_SITE_OTHER): Payer: No Typology Code available for payment source

## 2020-09-14 ENCOUNTER — Encounter: Payer: Self-pay | Admitting: Family Medicine

## 2020-09-14 DIAGNOSIS — R110 Nausea: Secondary | ICD-10-CM

## 2020-09-14 NOTE — [place] Notes (Signed)
Virtual Visit via Video Note  I connected with Stacy Meyer on 09/14/20 at  9:00 AM EST by a video enabled telemedicine application and verified that I am speaking with the correct person using two identifiers.  Location: Patient: home Provider: office   I discussed the limitations of evaluation and management by telemedicine and the availability of in person appointments. The patient expressed understanding and agreed to proceed.  Parties involved in encounter  Patient: Stacy Meyer  Provider:  Loura Pardon MD    History of Present Illness: 60 yo pt of NP Carlis Abbott presents with flu symptoms   Started Monday , tired and nausea  Then body aches and chills and sweats at night  Temp is low grade (100 max)   No vomiting , just nausea  Some diarrhea- Monday, loose , no bm since Tuesday   Stomach aches all over after she eats (very brief)   No known sick contacts   No resp symptoms but runny nose and that ended wednesday  She has been able to drink water (urine is dark yellow)  Glucose level has been fine -120s  Declines nausea medicine   otc-nothing  Home test covid neg tues   Immunization status  Had 2 doses of covid vaccine in sept and oct  Does not get flu shots    works at [employer]   Patient Active Problem List   Diagnosis Date Noted  . Nausea 09/14/2020  . Epigastric pain 01/05/2018  . Preventative health care 07/03/2017  . GERD (gastroesophageal reflux disease) 07/03/2017  . Hyperlipidemia 07/03/2017  . Type 2 diabetes mellitus ([place]) 02/16/2017  . Pedal edema 01/06/2013  . Chronic pain of right knee 01/06/2013  . Morbid obesity ([place])   . Mild intermittent asthma   . Allergic rhinitis 03/22/2010  . Hypertension, essential 03/22/2010   Past Medical History:  Diagnosis Date  . Anemia 02/2009  . Asthma   . Blood transfusion 2011   [place]  . Hypertension   . Morbid obesity ([place])    Past Surgical History:  Procedure Laterality  Date  . ABDOMINAL HYSTERECTOMY    . CARPAL TUNNEL RELEASE    . COLONOSCOPY WITH PROPOFOL N/A 10/10/2016   Procedure: COLONOSCOPY WITH PROPOFOL;  Surgeon: Jonathon Bellows, MD;  Location: [facility] END[facility]COPY;  Service: Endoscopy;  Laterality: N/A;  . EXCISION OF GANGLION CYST    . TUBAL LIGATION  1988  . VAGINAL HYSTERECTOMY  12/01/2011   Procedure: HYSTERECTOMY VAGINAL;  Surgeon: Terrance Mass, MD;  Location: [facility];  Service: Gynecology;  Laterality: Right;   Social History   Tobacco Use  . Smoking status: Never Smoker  . Smokeless tobacco: Never Used  Substance Use Topics  . Alcohol use: Yes    Comment: rarely  . Drug use: No   Family History  Problem Relation Age of Onset  . Breast cancer Mother   . Diabetes Father   . Hypertension Father   . Heart disease Father   . Ovarian cancer Sister   . Cancer Brother        Oral  . Colon cancer Brother    No Known Allergies Current Outpatient Medications on File Prior to Visit  Medication Sig Dispense Refill  . albuterol (VENTOLIN HFA) 108 (90 Base) MCG/ACT inhaler INHALE 2 PUFFS BY MOUTH EVERY 6 HOURS AS NEEDED FOR WHEEZING 8 g 0  . Fluticasone-Salmeterol (ADVAIR DISKUS) 100-50 MCG/DOSE AEPB INHALE ONE DOSE BY MOUTH EVERY 12 HOURS (Patient taking differently: INHALE ONE  DOSE BY MOUTH AS NEEDED) 60 each 6  . glucose blood (CONTOUR NEXT TEST) test strip Use to check blood sugar once a day 100 each 12  . Lancets MISC 1 each by Does not apply route daily. Use to obtain blood sugar sample once a day 100 each 3   No current facility-administered medications on file prior to visit.   Review of Systems  Constitutional: Positive for chills, fever and malaise/fatigue.  HENT: Negative for congestion, ear pain, sinus pain and sore throat.   Eyes: Negative for blurred vision, discharge and redness.  Respiratory: Negative for cough, sputum production, shortness of breath and stridor.   Cardiovascular: Negative for chest pain, palpitations and leg  swelling.  Gastrointestinal: Positive for nausea. Negative for abdominal pain, diarrhea and vomiting.       Some abdominal discomfort just right after eating  Loose stool for a day  Musculoskeletal: Negative for myalgias.  Skin: Negative for rash.  Neurological: Negative for dizziness and headaches.    Observations/Objective: Patient appears well, in no distress, but tired  Weight is baseline  No facial swelling or asymmetry Normal voice-not hoarse and no slurred speech No obvious tremor or mobility impairment Moving neck and UEs normally Able to hear the call well  No cough or shortness of breath during interview  Talkative and mentally sharp with no cognitive changes No skin changes on face or neck , no rash or pallor Affect is normal    Assessment and Plan: Problem List Items Addressed This Visit      Other   Nausea    Nausea with low grade temp/chills and aches Diarrhea one day and runny nose one day Neg home covid test Tuesday  Immunized with 2 vaccines in the fall  Out of work since Monday afternoon  covid test ordered and pt inst to isolate until result  Work note given ER precautions disc  Tylenol prn, fluid rehydration with slow adv of diet/bland  Update if not starting to improve in a week or if worsening            Follow Up Instructions: Keep drinking fluids  Watch for symptoms of dehydration (dizziness, rapid heart beat, dry mouth) and if you cannot keep fluids down -go to the ER For body aches and fever, tylenol would help  Alert [place] if any new symptoms  Advance diet very slowly when nausea improves   The office will call to schedule a covid test Please stay out of work until we get a result and you feel better   I discussed the assessment and treatment plan with the patient. The patient was provided an opportunity to ask questions and all were answered. The patient agreed with the plan and demonstrated an understanding of the instructions.   The  patient was advised to call back or seek an in-person evaluation if the symptoms worsen or if the condition fails to improve as anticipated.     Loura Pardon, MD

## 2020-09-14 NOTE — Patient Instructions (Signed)
Keep drinking fluids  Watch for symptoms of dehydration (dizziness, rapid heart beat, dry mouth) and if you cannot keep fluids down -go to the ER For body aches and fever, tylenol would help  Alert [place] if any new symptoms  Advance diet very slowly when nausea improves   The office will call to schedule a covid test Please stay out of work until we get a result and you feel better

## 2020-09-14 NOTE — Assessment & Plan Note (Signed)
Nausea with low grade temp/chills and aches Diarrhea one day and runny nose one day Neg home covid test Tuesday  Immunized with 2 vaccines in the fall  Out of work since Monday afternoon  covid test ordered and pt inst to isolate until result  Work note given ER precautions disc  Tylenol prn, fluid rehydration with slow adv of diet/bland  Update if not starting to improve in a week or if worsening

## 2020-09-15 LAB — SARS-COV-2, NAA 2 DAY TAT

## 2020-09-15 LAB — NOVEL CORONAVIRUS, NAA: SARS-CoV-2, NAA: NOT DETECTED

## 2020-12-11 ENCOUNTER — Other Ambulatory Visit: Payer: Self-pay | Admitting: Orthopedic Surgery

## 2020-12-11 DIAGNOSIS — M25562 Pain in left knee: Secondary | ICD-10-CM

## 2020-12-19 ENCOUNTER — Ambulatory Visit
Admission: RE | Admit: 2020-12-19 | Discharge: 2020-12-19 | Disposition: A | Discharge status: Home / Self Care | Payer: No Typology Code available for payment source | Source: Ambulatory Visit | Attending: Orthopedic Surgery | Admitting: Orthopedic Surgery

## 2020-12-19 ENCOUNTER — Other Ambulatory Visit: Payer: Self-pay

## 2020-12-19 DIAGNOSIS — M25562 Pain in left knee: Secondary | ICD-10-CM | POA: Diagnosis present

## 2022-01-15 ENCOUNTER — Ambulatory Visit: Payer: No Typology Code available for payment source | Admitting: Internal Medicine

## 2022-01-15 DIAGNOSIS — Z1211 Encounter for screening for malignant neoplasm of colon: Secondary | ICD-10-CM | POA: Diagnosis not present

## 2022-01-15 DIAGNOSIS — E1169 Type 2 diabetes mellitus with other specified complication: Secondary | ICD-10-CM | POA: Diagnosis not present

## 2022-01-15 DIAGNOSIS — R0683 Snoring: Secondary | ICD-10-CM | POA: Diagnosis not present

## 2022-01-15 DIAGNOSIS — R635 Abnormal weight gain: Secondary | ICD-10-CM | POA: Diagnosis not present

## 2022-01-15 DIAGNOSIS — Z1231 Encounter for screening mammogram for malignant neoplasm of breast: Secondary | ICD-10-CM | POA: Diagnosis not present

## 2022-01-15 DIAGNOSIS — R61 Generalized hyperhidrosis: Secondary | ICD-10-CM | POA: Diagnosis not present

## 2022-01-15 DIAGNOSIS — E785 Hyperlipidemia, unspecified: Secondary | ICD-10-CM | POA: Diagnosis not present

## 2022-01-15 DIAGNOSIS — R110 Nausea: Secondary | ICD-10-CM | POA: Diagnosis not present

## 2022-01-15 DIAGNOSIS — I10 Essential (primary) hypertension: Secondary | ICD-10-CM | POA: Diagnosis not present

## 2022-01-15 DIAGNOSIS — E559 Vitamin D deficiency, unspecified: Secondary | ICD-10-CM | POA: Diagnosis not present

## 2022-01-16 ENCOUNTER — Encounter: Payer: Self-pay | Admitting: Internal Medicine

## 2022-01-16 ENCOUNTER — Ambulatory Visit (INDEPENDENT_AMBULATORY_CARE_PROVIDER_SITE_OTHER): Payer: BC Managed Care – PPO | Admitting: Internal Medicine

## 2022-01-16 VITALS — BP 148/84 | HR 81 | Ht 64.0 in | Wt 348.2 lb

## 2022-01-16 DIAGNOSIS — R9431 Abnormal electrocardiogram [ECG] [EKG]: Secondary | ICD-10-CM | POA: Diagnosis not present

## 2022-01-16 DIAGNOSIS — Z6841 Body Mass Index (BMI) 40.0 and over, adult: Secondary | ICD-10-CM

## 2022-01-16 MED ORDER — METOPROLOL TARTRATE 100 MG PO TABS: 100.0000 mg | ORAL_TABLET | Freq: Once | ORAL | 0 refills | Status: DC

## 2022-01-16 NOTE — Patient Instructions (Signed)
Medication Instructions:  PLEASE TAKE METOPROLOL TARTRATE '100mg'$  TWO HOURS PRIOR TO CCTA SCAN  *If you need a refill on your cardiac medications before your next appointment, please call your pharmacy*  Lab Work: BMET- TODAY  If you have labs (blood work) drawn today and your tests are completely normal, you will receive your results only by: [place] (if you have MyChart) OR A paper copy in the mail If you have any lab test that is abnormal or we need to change your treatment, we will call you to review the results.  Testing/Procedures: Your physician has requested that you have cardiac CT. Cardiac computed tomography (CT) is a painless test that uses an x-ray machine to take clear, detailed pictures of your heart. For further information please visit HugeFiesta.tn. Please follow instruction sheet as given.  Follow-Up: At [facility], you and your health needs are our priority.  As part of our continuing mission to provide you with exceptional heart care, we have created designated Provider Care Teams.  These Care Teams include your primary Cardiologist (physician) and Advanced Practice Providers (APPs -  Physician Assistants and Nurse Practitioners) who all work together to provide you with the care you need, when you need it.  We recommend signing up for the patient portal called "MyChart".  Sign up information is provided on this After Visit Summary.  MyChart is used to connect with patients for Virtual Visits (Telemedicine).  Patients are able to view lab/test results, encounter notes, upcoming appointments, etc.  Non-urgent messages can be sent to your provider as well.   To learn more about what you can do with MyChart, go to [email or web address].    Your next appointment:   AS NEEDED   The format for your next appointment:   In Person  Provider:   Janina Mayo, MD    Other Instructions   Your cardiac CT will be scheduled at one of the below locations:    [facility] [address] [place], [place] [address] [phone]  If scheduled at [facility], please arrive at the [facility] and Children's Entrance (Entrance C2) of [facility] 30 minutes prior to test start time. You can use the FREE valet parking offered at entrance C (encouraged to control the heart rate for the test)  Proceed to the [facility] Radiology Department (first floor) to check-in and test prep.  All radiology patients and guests should use entrance C2 at [facility], accessed from [facility], even though the hospital's physical address listed is [address]    Please follow these instructions carefully (unless otherwise directed):  On the Night Before the Test: Be sure to Drink plenty of water. Do not consume any caffeinated/decaffeinated beverages or chocolate 12 hours prior to your test. Do not take any antihistamines 12 hours prior to your test.  On the Day of the Test: Drink plenty of water until 1 hour prior to the test. Do not eat any food 4 hours prior to the test. You may take your regular medications prior to the test.  Take metoprolol (Lopressor) two hours prior to test. HOLD Furosemide/Hydrochlorothiazide morning of the test. FEMALES- please wear underwire-free bra if available, avoid dresses & tight clothing      After the Test: Drink plenty of water. After receiving IV contrast, you may experience a mild flushed feeling. This is normal. On occasion, you may experience a mild rash up to 24 hours after the test. This  is not dangerous. If this occurs, you can take Benadryl 25 mg and increase your fluid intake. If you experience trouble breathing, this can be serious. If it is severe call 911 IMMEDIATELY. If it is mild, please call our office. If you take any of these medications: Glipizide/Metformin, Avandament, Glucavance, please do not take 48 hours after completing test unless otherwise  instructed.  We will call to schedule your test 2-4 weeks out understanding that some insurance companies will need an authorization prior to the service being performed.   For non-scheduling related questions, please contact the cardiac imaging nurse navigator should you have any questions/concerns: Marchia Bond, Cardiac Imaging Nurse Navigator Gordy Clement, Cardiac Imaging Nurse Navigator [facility] Direct Office Dial: [phone]   For scheduling needs, including cancellations and rescheduling, please call [place], [phone].

## 2022-01-16 NOTE — [place] Notes (Signed)
Cardiology Office Note:    Date:  01/16/2022   ID:  Stacy Meyer, DOB 1961-01-17, MRN [id number]  PCP:  Ileene Hutchinson, PA   [facility] Providers Cardiologist:  Janina Mayo, MD     Referring MD: Ileene Hutchinson, [place]   No chief complaint on file. Abnormal EKG  History of Present Illness:    Stacy Meyer is a 61 y.o. female with a hx of asthma, morbidly obese,  T2DM 6.7% referral to cardiology for diaphoresis and nausea at night.  She had EKG with primary provider showing sinus rhythm with minimal non specific TWI. She has notable snoring, planned for sleep eval. She wakes up at night sweating and nauseas this week. She felt hot. She had no significant exertion prior. No chest pressure. Her father had heart disease at the age of 49.  No premature CAD in the family. Non smoker.   Past Medical History:  Diagnosis Date   Anemia 02/2009   Asthma    Blood transfusion 2011      Hypertension    Morbid obesity ([place])     Past Surgical History:  Procedure Laterality Date   ABDOMINAL HYSTERECTOMY     CARPAL TUNNEL RELEASE     COLONOSCOPY WITH PROPOFOL N/A 10/10/2016   Procedure: COLONOSCOPY WITH PROPOFOL;  Surgeon: Jonathon Bellows, MD;  Location: [facility] END[facility];  Service: Endoscopy;  Laterality: N/A;   EXCISION OF GANGLION CYST     TUBAL LIGATION  1988   VAGINAL HYSTERECTOMY  12/01/2011   Procedure: HYSTERECTOMY VAGINAL;  Surgeon: Terrance Mass, MD;  Location: [facility];  Service: Gynecology;  Laterality: Right;    Current Medications: Current Meds  Medication Sig   metoprolol tartrate (LOPRESSOR) 100 MG tablet Take 1 tablet (100 mg total) by mouth once for 1 dose. PLEASE TAKE METOPROLOL 2  HOURS PRIOR TO CTA SCAN.     Allergies:   Patient has no known allergies.   Social History   Socioeconomic History   Marital status: Single    Spouse name: Not on file   Number of children: Not on file   Years of education: Not on file   Highest education level: Not on  file  Occupational History   Not on file  Tobacco Use   Smoking status: Never   Smokeless tobacco: Never  Substance and Sexual Activity   Alcohol use: Yes    Comment: rarely   Drug use: No   Sexual activity: Never    Birth control/protection: Pill  Other Topics Concern   Not on file  Social History Narrative   Single.   2 children, 1 grandchildren.   Works in [employer], Network engineer job.   Enjoys relaxing.   Social Determinants of Health   Financial Resource Strain: Not on file  Food Insecurity: Not on file  Transportation Needs: Not on file  Physical Activity: Not on file  Stress: Not on file  Social Connections: Not on file     Family History: The patient's family history includes Breast cancer in her mother; Cancer in her brother; Colon cancer in her brother; Diabetes in her father; Heart disease in her father; Hypertension in her father; Ovarian cancer in her sister.  ROS:   Please see the history of present illness.     All other systems reviewed and are negative.  EKGs/Labs/Other Studies Reviewed:    The following studies were reviewed today:   EKG:  EKG is  ordered today.  The ekg ordered today  demonstrates   01/15/2022- NSR , TWI mildly worse inferiorly  Recent Labs: No results found for requested labs within last 365 days.  Recent Lipid Panel    Component Value Date/Time   CHOL 196 01/17/2020 1535   TRIG 47.0 01/17/2020 1535   HDL 62.20 01/17/2020 1535   CHOLHDL 3 01/17/2020 1535   VLDL 9.4 01/17/2020 1535   LDLCALC 124 (H) 01/17/2020 1535     Risk Assessment/Calculations:           Physical Exam:    Vitals:   01/16/22 1114  BP: (!) 148/84  Pulse: 81     VS:  BP (!) 148/84   Pulse 81   Ht '5\' 4"'$  (1.626 m)   Wt (!) 348 lb 3.2 oz (157.9 kg)   LMP 09/20/2011   BMI 59.77 kg/m     Wt Readings from Last 3 Encounters:  01/16/22 (!) 348 lb 3.2 oz (157.9 kg)  09/14/20 (!) 350 lb (158.8 kg)  02/03/20 (!) 357 lb 8 oz (162.2 kg)     GEN:  Morbidly obese Well nourished, well developed in no acute distress HEENT: Normal NECK: No JVD; No carotid bruits LYMPHATICS: No lymphadenopathy CARDIAC: RRR, no murmurs, rubs, gallops RESPIRATORY:  Clear to auscultation without rales, wheezing or rhonchi  ABDOMEN: Soft, non-tender, non-distended MUSCULOSKELETAL:  No edema; No deformity  SKIN: Warm and dry NEUROLOGIC:  Alert and oriented x 3 PSYCHIATRIC:  Normal affect   ASSESSMENT:    Abnormal EKG: TWI mildly worsened inferiorly. Symptoms atypical but has DM2/female. Will opt for coronary CTA. If FFR+ recommend LHC and schedule follow up.   If has significant coronary calcium, will start statin  Obesity: referral healthy weight and wellness/medical weight management. Sleep referral placed by primary   HTN: elevated today, recommend ambulatory monitoring and lifestyle modification. If persistent elevated home Bps > 130/80 mmhg, recommend therapy. Can start chlorthalidone 25 mg daily and norvasc 5 mg daily.   PLAN:    In order of problems listed above:  Coronary CTA morph Follow up as needed      Medication Adjustments/Labs and Tests Ordered: Current medicines are reviewed at length with the patient today.  Concerns regarding medicines are outlined above.  Orders Placed This Encounter  Procedures   CT CORONARY MORPH W/CTA COR W/SCORE W/CA W/CM &/OR WO/CM   Basic metabolic panel   Amb Ref to Medical Weight Management   EKG 12-Lead   Meds ordered this encounter  Medications   metoprolol tartrate (LOPRESSOR) 100 MG tablet    Sig: Take 1 tablet (100 mg total) by mouth once for 1 dose. PLEASE TAKE METOPROLOL 2  HOURS PRIOR TO CTA SCAN.    Dispense:  1 tablet    Refill:  0    Patient Instructions  Medication Instructions:  PLEASE TAKE METOPROLOL TARTRATE '100mg'$  TWO HOURS PRIOR TO CCTA SCAN  *If you need a refill on your cardiac medications before your next appointment, please call your pharmacy*  Lab Work: BMET- TODAY  If  you have labs (blood work) drawn today and your tests are completely normal, you will receive your results only by: [place] (if you have MyChart) OR A paper copy in the mail If you have any lab test that is abnormal or we need to change your treatment, we will call you to review the results.  Testing/Procedures: Your physician has requested that you have cardiac CT. Cardiac computed tomography (CT) is a painless test that uses an x-ray machine to take  clear, detailed pictures of your heart. For further information please visit HugeFiesta.tn. Please follow instruction sheet as given.  Follow-Up: At [facility], you and your health needs are our priority.  As part of our continuing mission to provide you with exceptional heart care, we have created designated Provider Care Teams.  These Care Teams include your primary Cardiologist (physician) and Advanced Practice Providers (APPs -  Physician Assistants and Nurse Practitioners) who all work together to provide you with the care you need, when you need it.  We recommend signing up for the patient portal called "MyChart".  Sign up information is provided on this After Visit Summary.  MyChart is used to connect with patients for Virtual Visits (Telemedicine).  Patients are able to view lab/test results, encounter notes, upcoming appointments, etc.  Non-urgent messages can be sent to your provider as well.   To learn more about what you can do with MyChart, go to [email or web address].    Your next appointment:   AS NEEDED   The format for your next appointment:   In Person  Provider:   Janina Mayo, MD    Other Instructions   Your cardiac CT will be scheduled at one of the below locations:   [facility] [address] [place], [place] [address] [phone]  If scheduled at [facility], please arrive at the [facility] and Children's Entrance (Entrance C2) of [facility] 30 minutes prior to  test start time. You can use the FREE valet parking offered at entrance C (encouraged to control the heart rate for the test)  Proceed to the [facility] Radiology Department (first floor) to check-in and test prep.  All radiology patients and guests should use entrance C2 at [facility], accessed from [facility], even though the hospital's physical address listed is [address].    Please follow these instructions carefully (unless otherwise directed):  On the Night Before the Test: Be sure to Drink plenty of water. Do not consume any caffeinated/decaffeinated beverages or chocolate 12 hours prior to your test. Do not take any antihistamines 12 hours prior to your test.  On the Day of the Test: Drink plenty of water until 1 hour prior to the test. Do not eat any food 4 hours prior to the test. You may take your regular medications prior to the test.  Take metoprolol (Lopressor) two hours prior to test. HOLD Furosemide/Hydrochlorothiazide morning of the test. FEMALES- please wear underwire-free bra if available, avoid dresses & tight clothing      After the Test: Drink plenty of water. After receiving IV contrast, you may experience a mild flushed feeling. This is normal. On occasion, you may experience a mild rash up to 24 hours after the test. This is not dangerous. If this occurs, you can take Benadryl 25 mg and increase your fluid intake. If you experience trouble breathing, this can be serious. If it is severe call 911 IMMEDIATELY. If it is mild, please call our office. If you take any of these medications: Glipizide/Metformin, Avandament, Glucavance, please do not take 48 hours after completing test unless otherwise instructed.  We will call to schedule your test 2-4 weeks out understanding that some insurance companies will need an authorization prior to the service being performed.   For non-scheduling related questions, please contact the  cardiac imaging nurse navigator should you have any questions/concerns: Marchia Bond, Cardiac Imaging Nurse Navigator Gordy Clement, Cardiac Imaging Nurse Navigator [facility]  Heart and Vascular Services Direct Office Dial: [phone]   For scheduling needs, including cancellations and rescheduling, please call [place], [phone].          Signed, Janina Mayo, MD  01/16/2022 11:40 AM    [place]

## 2022-01-17 LAB — BASIC METABOLIC PANEL
BUN/Creatinine Ratio: 16 (ref 9–23)
BUN: 16 mg/dL (ref 6–24)
CO2: 23 mmol/L (ref 20–29)
Calcium: 9.2 mg/dL (ref 8.7–10.2)
Chloride: 108 mmol/L — ABNORMAL HIGH (ref 96–106)
Creatinine, Ser: 0.99 mg/dL (ref 0.57–1.00)
Glucose: 112 mg/dL — ABNORMAL HIGH (ref 70–99)
Potassium: 4 mmol/L (ref 3.5–5.2)
Sodium: 145 mmol/L — ABNORMAL HIGH (ref 134–144)
eGFR: 66 mL/min/{1.73_m2} (ref >59)

## 2022-02-13 ENCOUNTER — Telehealth (HOSPITAL_COMMUNITY): Payer: Self-pay | Admitting: *Deleted

## 2022-02-13 NOTE — Telephone Encounter (Signed)
Reaching out to patient to offer assistance regarding upcoming cardiac imaging study; pt verbalizes understanding of appt date/time, parking situation and where to check in, pre-test NPO status and medications ordered, and verified current allergies; name and call back number provided for further questions should they arise  Gitty Osterlund RN Navigator Cardiac Imaging [place] [facility] [phone] office [phone] cell  Patient to take 100mg metoprolol tartrate two hours prior to her cardiac CT scan.  She is aware to arrive at 2pm. 

## 2022-02-14 ENCOUNTER — Ambulatory Visit (HOSPITAL_COMMUNITY)
Admission: RE | Admit: 2022-02-14 | Discharge: 2022-02-14 | Disposition: A | Discharge status: Home / Self Care | Payer: BC Managed Care – PPO | Source: Ambulatory Visit | Attending: Internal Medicine | Admitting: Internal Medicine

## 2022-02-14 DIAGNOSIS — R9431 Abnormal electrocardiogram [ECG] [EKG]: Secondary | ICD-10-CM | POA: Insufficient documentation

## 2022-02-14 MED ORDER — IOHEXOL 350 MG/ML SOLN
100.0000 mL | Freq: Once | INTRAVENOUS | Status: AC | PRN
Start: 2022-02-14 — End: 2022-02-14
  Administered 2022-02-14: 140 mL via INTRAVENOUS

## 2022-02-14 MED ORDER — NITROGLYCERIN 0.4 MG SL SUBL
SUBLINGUAL_TABLET | SUBLINGUAL | Status: AC
  Filled 2022-02-14: qty 2

## 2022-02-14 MED ORDER — NITROGLYCERIN 0.4 MG SL SUBL
0.8000 mg | SUBLINGUAL_TABLET | Freq: Once | SUBLINGUAL | Status: AC
  Administered 2022-02-14: 0.8 mg via SUBLINGUAL

## 2022-04-18 DIAGNOSIS — K449 Diaphragmatic hernia without obstruction or gangrene: Secondary | ICD-10-CM | POA: Diagnosis not present

## 2022-04-18 DIAGNOSIS — I10 Essential (primary) hypertension: Secondary | ICD-10-CM | POA: Diagnosis not present

## 2022-04-18 DIAGNOSIS — E1169 Type 2 diabetes mellitus with other specified complication: Secondary | ICD-10-CM | POA: Diagnosis not present

## 2022-04-18 DIAGNOSIS — Z136 Encounter for screening for cardiovascular disorders: Secondary | ICD-10-CM | POA: Diagnosis not present

## 2022-09-24 DIAGNOSIS — I10 Essential (primary) hypertension: Secondary | ICD-10-CM | POA: Diagnosis not present

## 2022-09-24 DIAGNOSIS — M25562 Pain in left knee: Secondary | ICD-10-CM | POA: Diagnosis not present

## 2022-09-24 DIAGNOSIS — Z6841 Body Mass Index (BMI) 40.0 and over, adult: Secondary | ICD-10-CM | POA: Diagnosis not present

## 2023-07-17 ENCOUNTER — Encounter: Payer: Self-pay | Admitting: Nurse Practitioner

## 2023-07-17 ENCOUNTER — Ambulatory Visit: Payer: BC Managed Care – PPO | Admitting: Nurse Practitioner

## 2023-07-17 VITALS — BP 138/84 | HR 68 | Temp 97.9°F | Ht 62.25 in | Wt 359.8 lb

## 2023-07-17 DIAGNOSIS — Z1211 Encounter for screening for malignant neoplasm of colon: Secondary | ICD-10-CM

## 2023-07-17 DIAGNOSIS — E785 Hyperlipidemia, unspecified: Secondary | ICD-10-CM

## 2023-07-17 DIAGNOSIS — R600 Localized edema: Secondary | ICD-10-CM

## 2023-07-17 DIAGNOSIS — I10 Essential (primary) hypertension: Secondary | ICD-10-CM | POA: Diagnosis not present

## 2023-07-17 DIAGNOSIS — J4520 Mild intermittent asthma, uncomplicated: Secondary | ICD-10-CM | POA: Diagnosis not present

## 2023-07-17 DIAGNOSIS — Z0000 Encounter for general adult medical examination without abnormal findings: Secondary | ICD-10-CM

## 2023-07-17 DIAGNOSIS — E119 Type 2 diabetes mellitus without complications: Secondary | ICD-10-CM

## 2023-07-17 DIAGNOSIS — Z1231 Encounter for screening mammogram for malignant neoplasm of breast: Secondary | ICD-10-CM

## 2023-07-17 DIAGNOSIS — Z1321 Encounter for screening for nutritional disorder: Secondary | ICD-10-CM

## 2023-07-17 LAB — POCT GLYCOSYLATED HEMOGLOBIN (HGB A1C): Hemoglobin A1C: 5.9 % — AB (ref 4.0–5.6)

## 2023-07-17 MED ORDER — ALBUTEROL SULFATE HFA 108 (90 BASE) MCG/ACT IN AERS: 1.0000 | INHALATION_SPRAY | Freq: Four times a day (QID) | RESPIRATORY_TRACT | 1 refills | Status: AC | PRN

## 2023-07-17 MED ORDER — WEGOVY 0.25 MG/0.5ML ~~LOC~~ SOAJ: 0.2500 mg | SUBCUTANEOUS | 0 refills | Status: DC

## 2023-07-17 MED ORDER — FLUTICASONE-SALMETEROL 100-50 MCG/ACT IN AEPB: 1.0000 | INHALATION_SPRAY | Freq: Two times a day (BID) | RESPIRATORY_TRACT | 3 refills | Status: AC

## 2023-07-17 NOTE — Patient Instructions (Signed)
Nice to see you today I will be in touch with the labs once I review them Follow up with me in 3 months, sooner If you need me

## 2023-07-17 NOTE — Assessment & Plan Note (Signed)
Pending TSH, A1c, lipid panel.  Continue working hide less to modifications.  Will try Wegovy 0.25 mg once weekly.

## 2023-07-17 NOTE — Assessment & Plan Note (Signed)
History of the same.  Patient was prescribed a medication in the past was having low pain and stopped it.  This did not make a difference in her leg discomfort per her report

## 2023-07-17 NOTE — Assessment & Plan Note (Signed)
Patient currently maintained on lifestyle and diet modifications only.  A1c 5.9% today.  Detailed foot exam done today update urine micro.

## 2023-07-17 NOTE — Progress Notes (Signed)
New Patient Office Visit  Subjective    Patient ID: Stacy Meyer, female    DOB: 1960/10/07  Age: 62 y.o. MRN: [id number]  CC:  Chief Complaint  Patient presents with   Establish Care    Declines shingles, would like general check up and to discuss weight loss medications.     HPI Stacy Meyer presents to establish care   Asthma: albuterol and advair as needed. States that weather is the sme year round  HTN: amlodipine 5 mg and will check her blood pressure once a week. More frequently if she feels off  DM2: states that she will check it once a month. States that she was prescribed metofrmin but did not take it   Pap smear: Hysterectomy Mammogram: needs order  Dexa: too young  Colonoscopy: [facility] last one was 2018  Tdap: 2018 Flu: refused  Covid: original vaccine  Pna: refused Shingles: refused    Weight loss: staes that she is interested in medical weight loss. She has tried phenteramine in the past. Did not work . States she's that she did a salad diet that did not lose. States that she will eat 2 meals a day and some times none. States that she does snack. She likes chocolate. States she will do flavored water. States that she does walk at work. States that her son will get her out and walk until she can't for approx once a month   Glasses she will see them once a year Dentist: dental works. Needs updating   Outpatient Encounter Medications as of 07/17/2023  Medication Sig   amLODipine (NORVASC) 5 MG tablet    fluticasone-salmeterol (ADVAIR) 100-50 MCG/ACT AEPB Inhale 1 puff into the lungs 2 (two) times daily.   glucose blood (CONTOUR NEXT TEST) test strip Use to check blood sugar once a day   Lancets MISC 1 each by Does not apply route daily. Use to obtain blood sugar sample once a day   Semaglutide-Weight Management (WEGOVY) 0.25 MG/0.5ML SOAJ Inject 0.25 mg into the skin once a week.   [DISCONTINUED] albuterol (VENTOLIN HFA) 108 (90  Base) MCG/ACT inhaler INHALE 2 PUFFS BY MOUTH EVERY 6 HOURS AS NEEDED FOR WHEEZING   [DISCONTINUED] Fluticasone-Salmeterol (ADVAIR DISKUS) 100-50 MCG/DOSE AEPB INHALE ONE DOSE BY MOUTH EVERY 12 HOURS   albuterol (VENTOLIN HFA) 108 (90 Base) MCG/ACT inhaler Inhale 1-2 puffs into the lungs every 6 (six) hours as needed for wheezing or shortness of breath.   [DISCONTINUED] metoprolol tartrate (LOPRESSOR) 100 MG tablet Take 1 tablet (100 mg total) by mouth once for 1 dose. PLEASE TAKE METOPROLOL 2  HOURS PRIOR TO CTA SCAN.   No facility-administered encounter medications on file as of 07/17/2023.    Past Medical History:  Diagnosis Date   Anemia 02/2009   Asthma    Blood transfusion 2011      Diabetes mellitus without complication (HCC)    Hypertension    Morbid obesity (HCC)     Past Surgical History:  Procedure Laterality Date   ABDOMINAL HYSTERECTOMY     partial   CARPAL TUNNEL RELEASE     COLONOSCOPY WITH PROPOFOL N/A 10/10/2016   Procedure: COLONOSCOPY WITH PROPOFOL;  Surgeon: Wyline Mood, MD;  Location: [facility];  Service: Endoscopy;  Laterality: N/A;   EXCISION OF GANGLION CYST     TUBAL LIGATION  1988   VAGINAL HYSTERECTOMY  12/01/2011   Procedure: HYSTERECTOMY VAGINAL;  Surgeon: Ok Edwards, MD;  Location: [facility];  Service: Gynecology;  Laterality: Right;    Family History  Problem Relation Age of Onset   Breast cancer Mother 42   Cancer Father        prostate   Diabetes Father    Hypertension Father    Heart disease Father    Ovarian cancer Sister    Cancer Sister        lung   Cancer Brother        Oral   Colon cancer Brother     Social History   Socioeconomic History   Marital status: Single    Spouse name: Not on file   Number of children: 2   Years of education: Not on file   Highest education level: Not on file  Occupational History   Not on file  Tobacco Use   Smoking status: Never   Smokeless tobacco: Never  Vaping Use    Vaping status: Never Used  Substance and Sexual Activity   Alcohol use: Not Currently    Comment: rarely   Drug use: No   Sexual activity: Never    Birth control/protection: Pill  Other Topics Concern   Not on file  Social History Narrative   Single.   2 children, 1 grandchildren.   Works in the assembly    Enjoys relaxing.      Stacy Meyer (42)   Stacy Meyer (39)   Social Drivers of Health   Financial Resource Strain: Not on file  Food Insecurity: Not on file  Transportation Needs: Not on file  Physical Activity: Not on file  Stress: Not on file  Social Connections: Not on file  Intimate Partner Violence: Not on file    Review of Systems  Constitutional:  Positive for chills. Negative for fever.  Respiratory:  Negative for shortness of breath.   Cardiovascular:  Negative for chest pain and leg swelling.  Gastrointestinal:  Negative for abdominal pain, blood in stool, constipation, diarrhea, nausea and vomiting.       BM daily   Genitourinary:  Negative for dysuria and hematuria.  Neurological:  Negative for tingling and headaches.  Psychiatric/Behavioral:  Negative for hallucinations and suicidal ideas.         Objective    BP 138/84   Pulse 68   Temp 97.9 F (36.6 C) (Oral)   Ht 5' 2.25" (1.581 m)   Wt (!) 359 lb 12.8 oz (163.2 kg)   LMP 09/20/2011   SpO2 99%   BMI 65.28 kg/m   Physical Exam Vitals and nursing note reviewed.  Constitutional:      Appearance: Normal appearance. She is obese.  HENT:     Right Ear: Tympanic membrane, ear canal and external ear normal.     Left Ear: Tympanic membrane, ear canal and external ear normal.     Mouth/Throat:     Mouth: Mucous membranes are moist.     Pharynx: Oropharynx is clear.  Eyes:     Extraocular Movements: Extraocular movements intact.     Pupils: Pupils are equal, round, and reactive to light.  Cardiovascular:     Rate and Rhythm: Normal rate and regular rhythm.     Pulses: Normal pulses.     Heart  sounds: Normal heart sounds.  Pulmonary:     Effort: Pulmonary effort is normal.     Breath sounds: Normal breath sounds.  Abdominal:     General: Bowel sounds are normal. There is no distension.     Palpations: There is no mass.  Tenderness: There is no abdominal tenderness.     Hernia: No hernia is present.  Musculoskeletal:     Right lower leg: No edema.     Left lower leg: No edema.  Lymphadenopathy:     Cervical: No cervical adenopathy.  Skin:    General: Skin is warm.  Neurological:     General: No focal deficit present.     Mental Status: She is alert.     Comments: Bilateral upper and lower extremity strength 5/5  Psychiatric:        Mood and Affect: Mood normal.        Behavior: Behavior normal.        Thought Content: Thought content normal.        Judgment: Judgment normal.    Title   Diabetic Foot Exam - detailed Is there a history of foot ulcer?: No Is there a foot ulcer now?: No Is there swelling?: Yes Is there elevated skin temperature?: No Is there abnormal foot shape?: No Is there a claw toe deformity?: No Are the toenails long?: No Are the toenails thick?: No Are the toenails ingrown?: No Is the skin thin, fragile, shiny and hairless?": No Pulse Foot Exam completed.: Yes   Right Posterior Tibialis: Present Left posterior Tibialis: Present   Right Dorsalis Pedis: Present Left Dorsalis Pedis: Present     Sensory Foot Exam Completed.: Yes Semmes-Weinstein Monofilament Test "+" means "has sensation" and "-" means "no sensation"      Image components are not supported.   Image components are not supported. Image components are not supported.  Tuning Fork Comments All 10 sites sensation intact bilaterally           Assessment & Plan:   Problem List Items Addressed This Visit       Cardiovascular and Mediastinum   Hypertension, essential   Patient currently maintained on amlodipine 5 mg daily.  Blood pressure well-controlled.   Continue medication as prescribed      Relevant Medications   amLODipine (NORVASC) 5 MG tablet   Other Relevant Orders   CBC   Comprehensive metabolic panel   TSH     Respiratory   Mild intermittent asthma   Patient currently maintained on albuterol as needed and Advair as needed.  Refills provided for above      Relevant Medications   albuterol (VENTOLIN HFA) 108 (90 Base) MCG/ACT inhaler   fluticasone-salmeterol (ADVAIR) 100-50 MCG/ACT AEPB     Other   Morbid obesity (HCC)   Pending TSH, A1c, lipid panel.  Continue working hide less to modifications.  Will try Wegovy 0.25 mg once weekly.      Relevant Medications   Semaglutide-Weight Management (WEGOVY) 0.25 MG/0.5ML SOAJ   Pedal edema   Well controlled diabetes mellitus (HCC)   Patient currently maintained on lifestyle and diet modifications only.  A1c 5.9% today.  Detailed foot exam done today update urine micro.      Relevant Orders   CBC   Comprehensive metabolic panel   Lipid panel   Microalbumin / creatinine urine ratio   POCT glycosylated hemoglobin (Hb A1C) (Completed)   Preventative health care - Primary   Discussed age-appropriate immunizations and screening exams.  Did review patient's personal, surgical, social, family histories.  Patient is up-to-date on all age-appropriate vaccinations she would like.  Patient refused the flu vaccine, shingles vaccine, pneumonia vaccine.  Ambulatory referral to GI for colorectal cancer screening.  Patient no longer participates in cervical cancer screening status post  hysterectomy.  Ambulatory order placed for mammogram patient given information to call and set up appointment at her leisure.  Patient was given information at discharge about preventative healthcare maintenance with anticipatory guidance.      Relevant Orders   CBC   Comprehensive metabolic panel   TSH   Hyperlipidemia   History of the same.  Patient was prescribed a medication in the past was having low  pain and stopped it.  This did not make a difference in her leg discomfort per her report      Relevant Medications   amLODipine (NORVASC) 5 MG tablet   Semaglutide-Weight Management (WEGOVY) 0.25 MG/0.5ML SOAJ   Other Relevant Orders   Lipid panel   Other Visit Diagnoses       Encounter for vitamin deficiency screening       Relevant Orders   VITAMIN D 25 Hydroxy (Vit-D Deficiency, Fractures)     Screening mammogram for breast cancer       Relevant Orders   MM 3D SCREENING MAMMOGRAM BILATERAL BREAST     Screening for colon cancer       Relevant Orders   Ambulatory referral to Gastroenterology       Return in about 3 months (around 10/15/2023) for wegovy/weight recheck .   Audria Nine, NP

## 2023-07-17 NOTE — Assessment & Plan Note (Signed)
Patient currently maintained on amlodipine 5 mg daily.  Blood pressure well-controlled.  Continue medication as prescribed

## 2023-07-17 NOTE — Assessment & Plan Note (Signed)
Discussed age-appropriate immunizations and screening exams.  Did review patient's personal, surgical, social, family histories.  Patient is up-to-date on all age-appropriate vaccinations she would like.  Patient refused the flu vaccine, shingles vaccine, pneumonia vaccine.  Ambulatory referral to GI for colorectal cancer screening.  Patient no longer participates in cervical cancer screening status post hysterectomy.  Ambulatory order placed for mammogram patient given information to call and set up appointment at her leisure.  Patient was given information at discharge about preventative healthcare maintenance with anticipatory guidance.

## 2023-07-17 NOTE — Assessment & Plan Note (Signed)
Patient currently maintained on albuterol as needed and Advair as needed.  Refills provided for above

## 2023-07-18 LAB — CBC
HCT: 37.7 % (ref 35.0–45.0)
Hemoglobin: 12.2 g/dL (ref 11.7–15.5)
MCH: 27.7 pg (ref 27.0–33.0)
MCHC: 32.4 g/dL (ref 32.0–36.0)
MCV: 85.5 fL (ref 80.0–100.0)
MPV: 9.9 fL (ref 7.5–12.5)
Platelets: 357 10*3/uL (ref 140–400)
RBC: 4.41 10*6/uL (ref 3.80–5.10)
RDW: 12.8 % (ref 11.0–15.0)
WBC: 5.9 10*3/uL (ref 3.8–10.8)

## 2023-07-18 LAB — COMPREHENSIVE METABOLIC PANEL
AG Ratio: 1.5 (calc) (ref 1.0–2.5)
ALT: 7 U/L (ref 6–29)
AST: 14 U/L (ref 10–35)
Albumin: 4 g/dL (ref 3.6–5.1)
Alkaline phosphatase (APISO): 72 U/L (ref 37–153)
BUN: 20 mg/dL (ref 7–25)
CO2: 30 mmol/L (ref 20–32)
Calcium: 10 mg/dL (ref 8.6–10.4)
Chloride: 105 mmol/L (ref 98–110)
Creat: 0.96 mg/dL (ref 0.50–1.05)
Globulin: 2.6 g/dL (ref 1.9–3.7)
Glucose, Bld: 95 mg/dL (ref 65–99)
Potassium: 4 mmol/L (ref 3.5–5.3)
Sodium: 142 mmol/L (ref 135–146)
Total Bilirubin: 0.6 mg/dL (ref 0.2–1.2)
Total Protein: 6.6 g/dL (ref 6.1–8.1)

## 2023-07-18 LAB — LIPID PANEL
Cholesterol: 223 mg/dL — ABNORMAL HIGH (ref <200)
HDL: 76 mg/dL (ref >=50)
LDL Cholesterol (Calc): 133 mg/dL — ABNORMAL HIGH
Non-HDL Cholesterol (Calc): 147 mg/dL — ABNORMAL HIGH (ref <130)
Total CHOL/HDL Ratio: 2.9 (calc) (ref <5.0)
Triglycerides: 51 mg/dL (ref <150)

## 2023-07-18 LAB — TSH: TSH: 1.05 m[IU]/L (ref 0.40–4.50)

## 2023-07-18 LAB — MICROALBUMIN / CREATININE URINE RATIO
Creatinine, Urine: 244 mg/dL (ref 20–275)
Microalb Creat Ratio: 2 mg/g{creat} (ref <30)
Microalb, Ur: 0.5 mg/dL

## 2023-07-18 LAB — VITAMIN D 25 HYDROXY (VIT D DEFICIENCY, FRACTURES): Vit D, 25-Hydroxy: 18 ng/mL — ABNORMAL LOW (ref 30–100)

## 2023-07-21 ENCOUNTER — Other Ambulatory Visit: Payer: Self-pay | Admitting: Nurse Practitioner

## 2023-07-21 ENCOUNTER — Telehealth: Payer: Self-pay | Admitting: Pharmacist

## 2023-07-21 DIAGNOSIS — E559 Vitamin D deficiency, unspecified: Secondary | ICD-10-CM

## 2023-07-21 MED ORDER — VITAMIN D (ERGOCALCIFEROL) 1.25 MG (50000 UNIT) PO CAPS: 50000.0000 [IU] | ORAL_CAPSULE | ORAL | 0 refills | Status: DC

## 2023-07-21 NOTE — Telephone Encounter (Signed)
Pharmacy Patient Advocate Encounter   Received notification from Patient Pharmacy that prior authorization for [facility] 0.25MG /0.5ML auto-injectors is required/requested.   Insurance verification completed.   The patient is insured through [facility] .   Per test claim: PA required; PA submitted to above mentioned insurance via CoverMyMeds Key/confirmation #/EOC  [id number] Status is pending

## 2023-07-22 ENCOUNTER — Other Ambulatory Visit (HOSPITAL_COMMUNITY): Payer: Self-pay

## 2023-07-22 NOTE — Telephone Encounter (Signed)
Made pt aware through mychart. Pt is active on mychart.

## 2023-07-22 NOTE — Telephone Encounter (Signed)
Pharmacy Patient Advocate Encounter  Received notification from [facility] that Prior Authorization for [facility] 0.25MG /0.5ML auto-injectors has been APPROVED from 07/21/2023 to 07/20/2024. Ran test claim, Copay is $24.99. This test claim was processed through [facility]- copay amounts may vary at other pharmacies due to pharmacy/plan contracts, or as the patient moves through the different stages of their insurance plan.   PA #/Case ID/Reference #:  [id number]

## 2023-07-25 ENCOUNTER — Encounter: Payer: Self-pay | Admitting: *Deleted

## 2023-08-14 ENCOUNTER — Ambulatory Visit: Payer: Self-pay

## 2023-08-18 ENCOUNTER — Other Ambulatory Visit: Payer: Self-pay | Admitting: Nurse Practitioner

## 2023-08-18 DIAGNOSIS — E785 Hyperlipidemia, unspecified: Secondary | ICD-10-CM

## 2023-08-28 ENCOUNTER — Ambulatory Visit: Payer: BC Managed Care – PPO | Admitting: Nurse Practitioner

## 2023-08-28 ENCOUNTER — Ambulatory Visit
Admission: RE | Admit: 2023-08-28 | Discharge: 2023-08-28 | Disposition: A | Discharge status: Home / Self Care | Payer: BC Managed Care – PPO | Source: Ambulatory Visit | Attending: Nurse Practitioner | Admitting: Nurse Practitioner

## 2023-08-28 DIAGNOSIS — Z1231 Encounter for screening mammogram for malignant neoplasm of breast: Secondary | ICD-10-CM | POA: Diagnosis not present

## 2023-09-02 ENCOUNTER — Other Ambulatory Visit: Payer: Self-pay | Admitting: Nurse Practitioner

## 2023-09-02 DIAGNOSIS — R928 Other abnormal and inconclusive findings on diagnostic imaging of breast: Secondary | ICD-10-CM

## 2023-09-11 ENCOUNTER — Other Ambulatory Visit: Payer: BC Managed Care – PPO

## 2023-09-12 ENCOUNTER — Other Ambulatory Visit: Payer: Self-pay | Admitting: Nurse Practitioner

## 2023-09-12 DIAGNOSIS — E785 Hyperlipidemia, unspecified: Secondary | ICD-10-CM

## 2023-09-15 ENCOUNTER — Encounter: Payer: Self-pay | Admitting: Nurse Practitioner

## 2023-09-18 ENCOUNTER — Other Ambulatory Visit: Payer: BC Managed Care – PPO

## 2023-09-18 ENCOUNTER — Ambulatory Visit
Admission: RE | Admit: 2023-09-18 | Discharge: 2023-09-18 | Disposition: A | Discharge status: Home / Self Care | Payer: BC Managed Care – PPO | Source: Ambulatory Visit | Attending: Nurse Practitioner | Admitting: Nurse Practitioner

## 2023-09-18 DIAGNOSIS — N6325 Unspecified lump in the left breast, overlapping quadrants: Secondary | ICD-10-CM | POA: Diagnosis not present

## 2023-09-18 DIAGNOSIS — R928 Other abnormal and inconclusive findings on diagnostic imaging of breast: Secondary | ICD-10-CM

## 2023-09-18 DIAGNOSIS — N6002 Solitary cyst of left breast: Secondary | ICD-10-CM | POA: Diagnosis not present

## 2023-09-21 MED ORDER — WEGOVY 1 MG/0.5ML ~~LOC~~ SOAJ
1.0000 mg | SUBCUTANEOUS | 0 refills | Status: DC
Start: 2023-09-21 — End: 2023-10-23

## 2023-10-09 ENCOUNTER — Other Ambulatory Visit: Payer: Self-pay | Admitting: Nurse Practitioner

## 2023-10-09 DIAGNOSIS — E559 Vitamin D deficiency, unspecified: Secondary | ICD-10-CM

## 2023-10-14 ENCOUNTER — Other Ambulatory Visit: Payer: Self-pay | Admitting: Nurse Practitioner

## 2023-10-15 ENCOUNTER — Encounter: Payer: Self-pay | Admitting: Nurse Practitioner

## 2023-10-16 ENCOUNTER — Ambulatory Visit: Payer: BC Managed Care – PPO | Admitting: Nurse Practitioner

## 2023-10-16 ENCOUNTER — Ambulatory Visit: Admitting: Family Medicine

## 2023-10-16 ENCOUNTER — Encounter: Payer: Self-pay | Admitting: Family Medicine

## 2023-10-16 VITALS — BP 136/86 | HR 82 | Temp 98.7°F | Resp 20 | Ht 62.5 in | Wt 341.1 lb

## 2023-10-16 DIAGNOSIS — R110 Nausea: Secondary | ICD-10-CM

## 2023-10-16 DIAGNOSIS — R6883 Chills (without fever): Secondary | ICD-10-CM

## 2023-10-16 DIAGNOSIS — J0110 Acute frontal sinusitis, unspecified: Secondary | ICD-10-CM

## 2023-10-16 MED ORDER — AMOXICILLIN-POT CLAVULANATE 875-125 MG PO TABS: 1.0000 | ORAL_TABLET | Freq: Two times a day (BID) | ORAL | 0 refills | Status: DC

## 2023-10-16 MED ORDER — SACCHAROMYCES BOULARDII 250 MG PO CAPS: 250.0000 mg | ORAL_CAPSULE | Freq: Every day | ORAL | 0 refills | Status: DC

## 2023-10-16 MED ORDER — ONDANSETRON HCL 4 MG PO TABS: 4.0000 mg | ORAL_TABLET | Freq: Three times a day (TID) | ORAL | 0 refills | Status: DC | PRN

## 2023-10-16 NOTE — Patient Instructions (Addendum)
 It was a pleasure meeting you today. Thank you for allowing me to take part in your health care.  Our goals for today as we discussed include:  COVI, Flu A and B negative  Start Augmentin 1 tablet two times a day for 5 days Start Probiotics daily and continue for 14 days after stopping antibiotics  We will get some labs today.  If they are abnormal or we need to do something about them, I will call you.  If they are normal, I will send you a message on MyChart (if it is active) or a letter in the mail.  If you don't hear from [place] in 2 weeks, please call the office at the number below.    You can take Tylenol and/or Ibuprofen as needed for fever reduction and pain relief.   For cough: honey 1/2 to 1 teaspoon (you can dilute the honey in water or another fluid).  You can also use guaifenesin and dextromethorphan for cough. You can use a humidifier for chest congestion and cough.  If you don't have a humidifier, you can sit in the bathroom with the hot shower running.      For sore throat: try warm salt water gargles, cepacol lozenges, throat spray, warm tea or water with lemon/honey, popsicles or ice, or OTC cold relief medicine for throat discomfort.   For congestion: take a daily anti-histamine like Zyrtec, Claritin, and a oral decongestant, such as pseudoephedrine.  You can also use Flonase 1-2 sprays in each nostril daily.   It is important to stay hydrated: drink plenty of fluids (water, gatorade/powerade/pedialyte, juices, or teas) to keep your throat moisturized and help further relieve irritation/discomfort.    Follow up with PCP if symptoms worsen  This is a list of the screening recommended for you and due dates:  Health Maintenance  Topic Date Due   Pneumococcal Vaccination (1 of 2 - PCV) Never done   Eye exam for diabetics  Never done   Zoster (Shingles) Vaccine (1 of 2) Never done   Pap with HPV screening  09/24/2014   Colon Cancer Screening  07/31/2018   Complete foot exam    02/02/2021   Flu Shot  11/02/2023*   COVID-19 Vaccine (2 - 2024-25 season) 07/16/2024*   Hemoglobin A1C  01/15/2024   Yearly kidney function blood test for diabetes  07/16/2024   Yearly kidney health urinalysis for diabetes  07/16/2024   Mammogram  08/27/2025   DTaP/Tdap/Td vaccine (3 - Tdap) 07/04/2027   Hepatitis C Screening  Completed   HIV Screening  Completed   HPV Vaccine  Aged Out  *Topic was postponed. The date shown is not the original due date.     If you have any questions or concerns, please do not hesitate to call the office at [phone].  I look forward to our next visit and until then take care and stay safe.  Regards,   Dana Allan, MD   [facility]

## 2023-10-16 NOTE — Progress Notes (Signed)
 SUBJECTIVE:   Chief Complaint  Patient presents with   Nausea    Since Tuesday   Sinus Problem    Pain in face around eyes   HPI Presents for acute visit   Discussed the use of AI scribe software for clinical note transcription with the patient, who gave verbal consent to proceed.  History of Present Illness Araya Roel is a 63 year old female who presents with nausea and pressure in the head.  She has been experiencing severe nausea and intermittent pressure in the head since Tuesday. The nausea is debilitating, preventing her from eating, though she managed to consume some soup. The head pressure is located behind the eyes and is not associated with rhinorrhea or sneezing. She feels febrile with episodes of sweating followed by chills, although she has not measured her temperature due to a broken thermometer. No chest pain, cough, otalgia, or ear drainage. She has a history of similar pressure symptoms with sinusitis, but this episode is different as it is not constant and lacks nasal discharge. She has not taken a COVID test and has not experienced emesis, though the nausea is severe enough to make her feel like she might. The nausea began suddenly on Tuesday morning, preventing her from standing or lying down comfortably. By Thursday, she felt slightly better but was hit again with pressure and nausea. She occasionally takes Tylenol for the pressure, which provides some relief.  She is currently on Wegovy for weight management, having increased her dose to 1 mg two weeks ago. She has not experienced nausea with previous doses and has not yet increased to 1.7 mg. Her nephew, who lives with her, has a cold, and there have been sick individuals at her workplace. She has been trying to maintain distance from her nephew due to his smoking and from coworkers at the plant.  She has a history of hypertension but is not currently taking amlodipine (Norvasc) as it was not effective for  her. Her blood pressure was noted to be higher during episodes of pressure, but she does not consistently monitor it.  She has a history of asthma and seasonal allergies but does not use nasal sprays like Flonase due to epistaxis. There is some nasal drainage observed, likely related to her allergies and asthma, but no significant respiratory symptoms are present.      PERTINENT PMH / PSH: As above  OBJECTIVE:  BP 136/86   Pulse 82   Temp 98.7 F (37.1 C)   Resp 20   Ht 5' 2.5" (1.588 m)   Wt (!) 341 lb 2 oz (154.7 kg)   LMP 09/20/2011   SpO2 98%   BMI 61.40 kg/m    Physical Exam Vitals reviewed.  Constitutional:      General: She is not in acute distress.    Appearance: She is not ill-appearing.  HENT:     Head: Normocephalic.     Right Ear: Tympanic membrane, ear canal and external ear normal.     Left Ear: Tympanic membrane, ear canal and external ear normal.     Nose:     Right Turbinates: Swollen and pale.     Left Turbinates: Swollen and pale.     Right Sinus: Frontal sinus tenderness present.     Left Sinus: Frontal sinus tenderness present.     Mouth/Throat:     Mouth: Mucous membranes are moist.     Pharynx: Posterior oropharyngeal erythema present. No oropharyngeal exudate.  Eyes:  Extraocular Movements: Extraocular movements intact.     Conjunctiva/sclera: Conjunctivae normal.     Pupils: Pupils are equal, round, and reactive to light.  Neck:     Thyroid: No thyromegaly or thyroid tenderness.     Vascular: No carotid bruit.  Cardiovascular:     Rate and Rhythm: Normal rate and regular rhythm.     Pulses: Normal pulses.     Heart sounds: Normal heart sounds.  Pulmonary:     Effort: Pulmonary effort is normal.     Breath sounds: Normal breath sounds.  Abdominal:     General: Bowel sounds are normal. There is no distension.     Palpations: Abdomen is soft.     Tenderness: There is no abdominal tenderness. There is no right CVA tenderness, left CVA  tenderness, guarding or rebound.  Musculoskeletal:        General: Normal range of motion.     Cervical back: Normal range of motion.     Right lower leg: No edema.     Left lower leg: No edema.  Lymphadenopathy:     Cervical: No cervical adenopathy.  Skin:    Capillary Refill: Capillary refill takes less than 2 seconds.  Neurological:     General: No focal deficit present.     Mental Status: She is alert and oriented to person, place, and time. Mental status is at baseline.     Motor: No weakness.  Psychiatric:        Mood and Affect: Mood normal.        Behavior: Behavior normal.        Thought Content: Thought content normal.        Judgment: Judgment normal.           10/16/2023    1:25 PM 01/17/2020    3:02 PM  Depression screen PHQ 2/9  Decreased Interest 0 0  Down, Depressed, Hopeless 0 0  PHQ - 2 Score 0 0  Altered sleeping 0   Tired, decreased energy 0   Change in appetite 0   Feeling bad or failure about yourself  0   Trouble concentrating 0   Moving slowly or fidgety/restless 0   Suicidal thoughts 0   PHQ-9 Score 0   Difficult doing work/chores Not difficult at all       10/16/2023    1:25 PM  GAD 7 : Generalized Anxiety Score  Nervous, Anxious, on Edge 0  Control/stop worrying 0  Worry too much - different things 0  Trouble relaxing 0  Restless 0  Easily annoyed or irritable 0  Afraid - awful might happen 0  Total GAD 7 Score 0  Anxiety Difficulty Not difficult at all    ASSESSMENT/PLAN:  Acute non-recurrent frontal sinusitis Assessment & Plan: Symptoms suggest viral URI. Sinus pressure without nasal drainage - Flu A&B negative - Will treat with antibiotics today given subjective chills - Probiotics daily and continue for 14 days after treatment   Orders: -     Amoxicillin-Pot Clavulanate; Take 1 tablet by mouth 2 (two) times daily.  Dispense: 20 tablet; Refill: 0 -     Saccharomyces boulardii; Take 1 capsule (250 mg total) by mouth daily.   Dispense: 90 capsule; Refill: 0  Chills (without fever) Assessment & Plan: COVID, Flu A&B negative  Orders: -     POC COVID-19 BinaxNow -     POCT Influenza A/B -     CBC  Nausea Assessment & Plan: Nausea coincides with URI  symptoms. Reginal Lutes unlikely cause due to timing. Advised against dose increase until acclimated. Consider CT if symptoms persist. - Advise against increasing Wegovy dose until acclimated. -Check labs today - Refill Zofran   Orders: -     Ondansetron HCl; Take 1 tablet (4 mg total) by mouth every 8 (eight) hours as needed for nausea or vomiting.  Dispense: 20 tablet; Refill: 0 -     Comprehensive metabolic panel -     Lipase   PDMP reviewed  Return if symptoms worsen or fail to improve, for PCP.  Dana Allan, MD

## 2023-10-17 LAB — CBC
HCT: 42.7 % (ref 35.0–45.0)
Hemoglobin: 13.2 g/dL (ref 11.7–15.5)
MCH: 26.5 pg — ABNORMAL LOW (ref 27.0–33.0)
MCHC: 30.9 g/dL — ABNORMAL LOW (ref 32.0–36.0)
MCV: 85.6 fL (ref 80.0–100.0)
MPV: 9.4 fL (ref 7.5–12.5)
Platelets: 362 10*3/uL (ref 140–400)
RBC: 4.99 10*6/uL (ref 3.80–5.10)
RDW: 13.5 % (ref 11.0–15.0)
WBC: 5.3 10*3/uL (ref 3.8–10.8)

## 2023-10-17 LAB — COMPREHENSIVE METABOLIC PANEL
AG Ratio: 1.3 (calc) (ref 1.0–2.5)
ALT: 3 U/L — ABNORMAL LOW (ref 6–29)
AST: 12 U/L (ref 10–35)
Albumin: 3.5 g/dL — ABNORMAL LOW (ref 3.6–5.1)
Alkaline phosphatase (APISO): 61 U/L (ref 37–153)
BUN: 13 mg/dL (ref 7–25)
CO2: 30 mmol/L (ref 20–32)
Calcium: 8.8 mg/dL (ref 8.6–10.4)
Chloride: 106 mmol/L (ref 98–110)
Creat: 1.02 mg/dL (ref 0.50–1.05)
Globulin: 2.6 g/dL (ref 1.9–3.7)
Glucose, Bld: 91 mg/dL (ref 65–99)
Potassium: 4.2 mmol/L (ref 3.5–5.3)
Sodium: 143 mmol/L (ref 135–146)
Total Bilirubin: 0.6 mg/dL (ref 0.2–1.2)
Total Protein: 6.1 g/dL (ref 6.1–8.1)

## 2023-10-17 LAB — LIPASE: Lipase: 32 U/L (ref 7–60)

## 2023-10-19 NOTE — Telephone Encounter (Signed)
 Is patient ready to go to the next dose up?

## 2023-10-21 ENCOUNTER — Encounter: Payer: Self-pay | Admitting: Family Medicine

## 2023-10-21 DIAGNOSIS — R6883 Chills (without fever): Secondary | ICD-10-CM | POA: Insufficient documentation

## 2023-10-21 DIAGNOSIS — J0110 Acute frontal sinusitis, unspecified: Secondary | ICD-10-CM | POA: Insufficient documentation

## 2023-10-21 LAB — POCT INFLUENZA A/B
Influenza A, POC: NEGATIVE
Influenza B, POC: NEGATIVE

## 2023-10-21 LAB — POC COVID19 BINAXNOW: SARS Coronavirus 2 Ag: NEGATIVE

## 2023-10-21 NOTE — Assessment & Plan Note (Signed)
 COVID, Flu A&B negative

## 2023-10-21 NOTE — Assessment & Plan Note (Addendum)
 Nausea coincides with URI symptoms. Reginal Lutes unlikely cause due to timing. Advised against dose increase until acclimated. Consider CT if symptoms persist. - Advise against increasing Wegovy dose until acclimated. -Check labs today - Refill Zofran

## 2023-10-21 NOTE — Assessment & Plan Note (Signed)
 Symptoms suggest viral URI. Sinus pressure without nasal drainage - Flu A&B negative - Will treat with antibiotics today given subjective chills - Probiotics daily and continue for 14 days after treatment

## 2023-10-22 ENCOUNTER — Telehealth: Payer: Self-pay

## 2023-10-22 NOTE — Telephone Encounter (Signed)
-----   Message from Dana Allan sent at 10/21/2023  4:26 PM EDT ----- No indication of infection.   Electrolytes normal  Follow up with PCP as scheduled

## 2023-10-22 NOTE — Telephone Encounter (Signed)
 Copied from CRM [id number]. Topic: General - Other >> Oct 22, 2023  9:17 AM Truddie Crumble wrote: Reason for CRM: patient returned call to [facility] and the message was read to the patient and she stated she saw it on mychart

## 2023-10-22 NOTE — Telephone Encounter (Signed)
 Left message to return call to our office.  Ok to give pt results. Please document when pt is spoke to.

## 2023-10-23 ENCOUNTER — Ambulatory Visit: Payer: BC Managed Care – PPO | Admitting: Nurse Practitioner

## 2023-10-23 DIAGNOSIS — I10 Essential (primary) hypertension: Secondary | ICD-10-CM

## 2023-10-23 DIAGNOSIS — K5903 Drug induced constipation: Secondary | ICD-10-CM | POA: Diagnosis not present

## 2023-10-23 MED ORDER — WEGOVY 1.7 MG/0.75ML ~~LOC~~ SOAJ: 1.7000 mg | SUBCUTANEOUS | 0 refills | Status: DC

## 2023-10-23 MED ORDER — POLYETHYLENE GLYCOL 3350 17 GM/SCOOP PO POWD: 17.0000 g | Freq: Every day | ORAL | 3 refills | Status: DC

## 2023-10-23 NOTE — Assessment & Plan Note (Addendum)
 Currently on escalating doses of Wegovy and has been at 1 mg for last month. Minimal nausea and constipation present only during this last week of treatment. Will escalate to 1.7 mg weekly. Educated patient on increasing dietary fiber intake, good hydration and will also start Miralax 17 GM daily.   I evaluated patient, was consulted regarding treatment, and agree with assessment and plan per Denice Bors RN, FNP Student   Audria Nine, DNP, AGNP-C

## 2023-10-23 NOTE — Assessment & Plan Note (Signed)
 Blood pressure elevated this operative.  Previous 2 within normal limits patient still working a lot of modifications.  Continue current regimen  I evaluated patient, was consulted regarding treatment, and agree with assessment and plan per Denice Bors RN, FNP Student   Audria Nine, DNP, AGNP-C

## 2023-10-23 NOTE — Progress Notes (Signed)
 Established Patient Office Visit  Subjective   Patient ID: Stacy Meyer, female    DOB: [birth year]  Age: 63 y.o. MRN: [id number]  Chief Complaint  Patient presents with   Medication Management    Wegovy refill and weight check. 359lbs last visit in decemeber. Pt weighed 340.6 today.      HPI  Stacy Meyer has a hx of HTN, HLD, DM2, and obesity. She is here today for a follow up on weight management. She is currently maintained on Wegovy 1 mg weekly. She reports episodes of mild nausea relieved after eating a cracker. Before this week she was having BM daily. This week she has had constipation and only had 1 BM in the week. During the first month of treatment she reports she was only eating one meal a day. Now she is eating 2 meals a days without snacks. She is drinking 64-128 oz of water per day. No formal exercise. She checks her blood sugar weekly fasting in the AM and averages at 104.      Review of Systems  Constitutional:  Positive for weight loss. Negative for chills and fever.       Weight loss intentional   Respiratory:  Negative for shortness of breath.   Cardiovascular:  Negative for chest pain and palpitations.  Gastrointestinal:  Positive for constipation and nausea. Negative for abdominal pain and vomiting.       BM once weekly.  Neurological:  Negative for dizziness and headaches.      Objective:     BP (!) 140/90   Pulse 73   Temp 97.9 F (36.6 C) (Oral)   Ht 5' 2.5" (1.588 m)   Wt (!) 154.5 kg   LMP 09/20/2011   SpO2 96%   BMI 61.30 kg/m  BP Readings from Last 3 Encounters:  10/23/23 (!) 140/90  10/16/23 136/86  07/17/23 138/84   Wt Readings from Last 3 Encounters:  10/23/23 (!) 154.5 kg  10/16/23 (!) 154.7 kg  07/17/23 (!) 163.2 kg      Physical Exam Constitutional:      Appearance: Normal appearance.  Cardiovascular:     Rate and Rhythm: Normal rate and regular rhythm.     Heart sounds: Normal heart sounds. No murmur heard.    No  friction rub. No gallop.  Pulmonary:     Effort: Pulmonary effort is normal.     Breath sounds: Normal breath sounds.  Abdominal:     General: Bowel sounds are normal.     Palpations: Abdomen is soft.  Neurological:     Mental Status: She is alert and oriented to person, place, and time.  Psychiatric:        Mood and Affect: Mood normal.        Behavior: Behavior normal.      No results found for any visits on 10/23/23.    The 10-year ASCVD risk score (Arnett DK, et al., 2019) is: 14%    Assessment & Plan:   Problem List Items Addressed This Visit     Hypertension, essential   Blood pressure elevated this operative.  Previous 2 within normal limits patient still working a lot of modifications.  Continue current regimen  I evaluated patient, was consulted regarding treatment, and agree with assessment and plan per Denice Bors RN, FNP Student   Audria Nine, DNP, AGNP-C       Morbid obesity [facility]) - Primary   Currently on escalating doses of Wegovy and has been at 1  mg for last month. Minimal nausea and constipation present only during this last week of treatment. Will escalate to 1.7 mg weekly. Educated patient on increasing dietary fiber intake, good hydration and will also start Miralax 17 GM daily.   I evaluated patient, was consulted regarding treatment, and agree with assessment and plan per Denice Bors RN, FNP Student   Audria Nine, DNP, AGNP-C       Relevant Medications   Semaglutide-Weight Management (WEGOVY) 1.7 MG/0.75ML SOAJ   Drug-induced constipation   Discussed increasing dietary fiber intake. She is already hydrating well. Start Miralax 17 GM daily. If symptoms worsen will consider lowering Wegovy dose.   I evaluated patient, was consulted regarding treatment, and agree with assessment and plan per Denice Bors RN, FNP Student   Audria Nine, DNP, AGNP-C       Relevant Medications   polyethylene glycol powder (GLYCOLAX/MIRALAX) 17 GM/SCOOP powder    Return  in about 3 months (around 01/23/2024) for weight recheck.    Murvin Donning, RN

## 2023-10-23 NOTE — Patient Instructions (Signed)
 It was a pleasure seeing you today.  We have sent your new Wegovy dose and a Miralax prescription to the pharmacy.  We will see you again in 3 months.

## 2023-10-23 NOTE — Progress Notes (Signed)
 Established Patient Office Visit  Subjective   Patient ID: Stacy Meyer, female    DOB: 01/11/1961  Age: 63 y.o. MRN: [id number]  Chief Complaint  Patient presents with   Medication Management    Wegovy refill and weight check. 359lbs last visit in decemeber. Pt weighed 340.6 today.      HPI  Obesity: patient was seen by me and started on wegocy on 13/13/2024. She is currenlty finishing up the 1mg  dose and has lost approx 20 pounds. She is tolerating the medication decently well and is ready for the next dose. She started out eating 1 meal a day and is now up to two meals without snacking. She is drinking 64+ oz of water a day. States that she is not doing a lot of exercising due to her knee bothering  States that she has been having a BM every day until she started taking this last dose this past week. She is not having any pain and has not tried anything over the counter    Review of Systems  Constitutional:  Positive for weight loss. Negative for chills and fever.  Respiratory:  Negative for shortness of breath.   Cardiovascular:  Negative for chest pain.  Gastrointestinal:  Positive for constipation. Negative for abdominal pain, diarrhea, nausea and vomiting.  Neurological:  Negative for headaches.      Objective:     BP (!) 140/90   Pulse 73   Temp 97.9 F (36.6 C) (Oral)   Ht 5' 2.5" (1.588 m)   Wt (!) 340 lb 9.6 oz (154.5 kg)   LMP 09/20/2011   SpO2 96%   BMI 61.30 kg/m  BP Readings from Last 3 Encounters:  10/23/23 (!) 140/90  10/16/23 136/86  07/17/23 138/84   Wt Readings from Last 3 Encounters:  10/23/23 (!) 340 lb 9.6 oz (154.5 kg)  10/16/23 (!) 341 lb 2 oz (154.7 kg)  07/17/23 (!) 359 lb 12.8 oz (163.2 kg)   SpO2 Readings from Last 3 Encounters:  10/23/23 96%  10/16/23 98%  07/17/23 99%      Physical Exam Vitals and nursing note reviewed.  Constitutional:      Appearance: Normal appearance.  Cardiovascular:     Rate and Rhythm:  Normal rate and regular rhythm.     Heart sounds: Normal heart sounds.  Pulmonary:     Effort: Pulmonary effort is normal.     Breath sounds: Normal breath sounds.  Abdominal:     General: Bowel sounds are normal. There is no distension.     Palpations: There is no mass.     Tenderness: There is no abdominal tenderness.     Hernia: No hernia is present.  Neurological:     Mental Status: She is alert.      No results found for any visits on 10/23/23.    The 10-year ASCVD risk score (Arnett DK, et al., 2019) is: 14%    Assessment & Plan:   Problem List Items Addressed This Visit       Cardiovascular and Mediastinum   Hypertension, essential   Blood pressure elevated this operative.  Previous 2 within normal limits patient still working a lot of modifications.  Continue current regimen  I evaluated patient, was consulted regarding treatment, and agree with assessment and plan per Denice Bors RN, FNP Student   Audria Nine, DNP, AGNP-C         Digestive   Drug-induced constipation   Discussed increasing dietary fiber intake.  She is already hydrating well. Start Miralax 17 GM daily. If symptoms worsen will consider lowering Wegovy dose.   I evaluated patient, was consulted regarding treatment, and agree with assessment and plan per Denice Bors RN, FNP Student   Audria Nine, DNP, AGNP-C       Relevant Medications   polyethylene glycol powder (GLYCOLAX/MIRALAX) 17 GM/SCOOP powder     Other   Morbid obesity (HCC) - Primary   Currently on escalating doses of Wegovy and has been at 1 mg for last month. Minimal nausea and constipation present only during this last week of treatment. Will escalate to 1.7 mg weekly. Educated patient on increasing dietary fiber intake, good hydration and will also start Miralax 17 GM daily.   I evaluated patient, was consulted regarding treatment, and agree with assessment and plan per Denice Bors RN, FNP Student   Audria Nine, DNP, AGNP-C        Relevant Medications   Semaglutide-Weight Management (WEGOVY) 1.7 MG/0.75ML SOAJ    Return in about 3 months (around 01/23/2024) for weight recheck.    Audria Nine, NP

## 2023-10-23 NOTE — Assessment & Plan Note (Addendum)
 Discussed increasing dietary fiber intake. She is already hydrating well. Start Miralax 17 GM daily. If symptoms worsen will consider lowering Wegovy dose.   I evaluated patient, was consulted regarding treatment, and agree with assessment and plan per Denice Bors RN, FNP Student   Audria Nine, DNP, AGNP-C

## 2023-11-02 NOTE — Telephone Encounter (Signed)
 Per chart review Wegovy 1.7mg  was sent in on 3/21

## 2023-11-18 ENCOUNTER — Other Ambulatory Visit: Payer: Self-pay | Admitting: Nurse Practitioner

## 2023-11-19 ENCOUNTER — Other Ambulatory Visit: Payer: Self-pay | Admitting: Nurse Practitioner

## 2023-11-19 MED ORDER — WEGOVY 2.4 MG/0.75ML ~~LOC~~ SOAJ: 2.4000 mg | SUBCUTANEOUS | 0 refills | Status: DC

## 2023-12-16 ENCOUNTER — Ambulatory Visit: Payer: Self-pay

## 2023-12-16 ENCOUNTER — Other Ambulatory Visit: Payer: Self-pay | Admitting: Nurse Practitioner

## 2023-12-16 NOTE — Telephone Encounter (Signed)
  Chief Complaint: Abdominal Pain Symptoms: lower abdominal pain Frequency: started over the weekend Pertinent Negatives: Patient denies CP, SOB, nausea, vomiting Disposition: [] ED /[] Urgent Care (no appt availability in office) / [] Appointment(In office/virtual)/ []  [place] [facility]/ [] Home Care/ [] Refused Recommended Disposition /[] [place] [facility]/ []  Follow-up with PCP Additional Notes: Patient calling with concerns for lower abdominal pain that started over the weekend. Patient endorses using Wegovy . Patient states she is having lower abdominal pain that increases with movement. Patient is unsure if this symptom is related to her Wegovy  or to something else. Denies CP, SOB, nausea and vomiting. Per protocol, patient is recommended to be seen today but patient refused to be scheduled with another provider. Only wants to be scheduled with PCP-no appointments available with PCP. Patient is needing a follow up call from office. Patient verbalized understanding of the plan and all questions answered.    Copied from CRM [id number]. Topic: Clinical - Red Word Triage >> Dec 16, 2023  9:02 AM Albertha Alosa wrote: [facility] that prompted transfer to Nurse Triage: Patient stated she is experiencing  some abdominal pain Reason for Disposition  [1] MILD-MODERATE pain AND [2] constant AND [3] present > 2 hours  Answer Assessment - Initial Assessment Questions 1. LOCATION: "Where does it hurt?"      Lower abdominal pain 2. RADIATION: "Does the pain shoot anywhere else?" (e.g., chest, back)     No radiation 3. ONSET: "When did the pain begin?" (e.g., minutes, hours or days ago)      Started Sunday morning 4. SUDDEN: "Gradual or sudden onset?"     gradual 5. PATTERN "Does the pain come and go, or is it constant?"    - If it comes and goes: "How long does it last?" "Do you have pain now?"     (Note: Comes and goes means the pain is intermittent. It goes away completely between bouts.)    - If  constant: "Is it getting better, staying the same, or getting worse?"      (Note: Constant means the pain never goes away completely; most serious pain is constant and gets worse.)      Constant-patient states pain is awful to walk 6. SEVERITY: "How bad is the pain?"  (e.g., Scale 1-10; mild, moderate, or severe)    - MILD (1-3): Doesn't interfere with normal activities, abdomen soft and not tender to touch.     - MODERATE (4-7): Interferes with normal activities or awakens from sleep, abdomen tender to touch.     - SEVERE (8-10): Excruciating pain, doubled over, unable to do any normal activities.       4-5 out of 10 7. RECURRENT SYMPTOM: "Have you ever had this type of stomach pain before?" If Yes, ask: "When was the last time?" and "What happened that time?"      No 8. CAUSE: "What do you think is causing the stomach pain?"     Concerned pain is connected to Wegovy  shot 9. RELIEVING/AGGRAVATING FACTORS: "What makes it better or worse?" (e.g., antacids, bending or twisting motion, bowel movement)     Moving from positions-pain is worse 10. OTHER SYMPTOMS: "Do you have any other symptoms?" (e.g., back pain, diarrhea, fever, urination pain, vomiting)       no  Protocols used: Abdominal Pain - Female-A-AH

## 2023-12-16 NOTE — Telephone Encounter (Addendum)
 Consulted with Doretha Ganja, CMA for Stacy Meyer to see if there were any work in slots in the next few days but there were not. Dr Joelle Musca has multiple openings today and tomorrow. I called the pt to let her know I had spoken with Stacy Meyer first to see what we could do. I advised that I could make her an appt with Dr Joelle Musca today or tomorrow and that Stacy Meyer works right next to Dr Joelle Musca and can easily consult with each other if needed. She agreed to make an appt with Dr Joelle Musca tomorrow. Made appt for 9am 12-17-23. I did advise that if she gets worse, go to an UC.

## 2023-12-17 ENCOUNTER — Ambulatory Visit (INDEPENDENT_AMBULATORY_CARE_PROVIDER_SITE_OTHER): Admitting: Internal Medicine

## 2023-12-17 ENCOUNTER — Encounter: Payer: Self-pay | Admitting: Internal Medicine

## 2023-12-17 VITALS — BP 136/84 | HR 86 | Temp 98.9°F | Ht 62.5 in | Wt 338.0 lb

## 2023-12-17 DIAGNOSIS — R1030 Lower abdominal pain, unspecified: Secondary | ICD-10-CM | POA: Insufficient documentation

## 2023-12-17 LAB — POC URINALSYSI DIPSTICK (AUTOMATED)
Glucose, UA: NEGATIVE
Leukocytes, UA: NEGATIVE
Nitrite, UA: NEGATIVE
Protein, UA: POSITIVE — AB
Spec Grav, UA: 1.025 (ref 1.010–1.025)
Urobilinogen, UA: 1 U/dL
pH, UA: 5.5 (ref 5.0–8.0)

## 2023-12-17 NOTE — Progress Notes (Signed)
 Subjective:    Patient ID: Stacy Meyer, female    DOB: August 09, 1960, 63 y.o.   MRN: [id number]  HPI Here due to lower abdominal pain  Woke up 4 days ago with pain Could barely walk getting up to the bathroom Uncomfortable when lying down--trouble even getting up to sitting position Sleeping in sitting position since then Some shooting pain when lifting left leg  No dysuria or urinary frequency or urgency No N/V Eating okay Bowels have been slow on the wegovy --has had 2 small pieces of stool in last few days Tried miralax  daily for 5 days. Dulcolax didn't help  Does lift a lot at work--but no clear injury  Current Outpatient Medications on File Prior to Visit  Medication Sig Dispense Refill   albuterol  (VENTOLIN  HFA) 108 (90 Base) MCG/ACT inhaler Inhale 1-2 puffs into the lungs every 6 (six) hours as needed for wheezing or shortness of breath. 8 g 1   fluticasone -salmeterol (ADVAIR) 100-50 MCG/ACT AEPB Inhale 1 puff into the lungs 2 (two) times daily. 1 each 3   glucose blood (CONTOUR NEXT TEST) test strip Use to check blood sugar once a day 100 each 12   Lancets MISC 1 each by Does not apply route daily. Use to obtain blood sugar sample once a day 100 each 3   polyethylene glycol powder (GLYCOLAX /MIRALAX ) 17 GM/SCOOP powder Take 17 g by mouth daily. 510 g 3   Probiotic Product (PROBIOTIC PO) Take by mouth.     Semaglutide -Weight Management (WEGOVY ) 2.4 MG/0.75ML SOAJ INJECT 2.4 MG INTO THE SKIN ONCE A WEEK. 2 mL 0   No current facility-administered medications on file prior to visit.    No Known Allergies  Past Medical History:  Diagnosis Date   Anemia 02/2009   Asthma    Blood transfusion 2011   [place]   Diabetes mellitus without complication (HCC)    Hypertension    Morbid obesity (HCC)     Past Surgical History:  Procedure Laterality Date   ABDOMINAL HYSTERECTOMY     partial   CARPAL TUNNEL RELEASE     COLONOSCOPY WITH PROPOFOL  N/A 10/10/2016    Procedure: COLONOSCOPY WITH PROPOFOL ;  Surgeon: Luke Salaam, MD;  Location: [facility];  Service: Endoscopy;  Laterality: N/A;   EXCISION OF GANGLION CYST     TUBAL LIGATION  1988   VAGINAL HYSTERECTOMY  12/01/2011   Procedure: HYSTERECTOMY VAGINAL;  Surgeon: Davia Erps, MD;  Location: [facility];  Service: Gynecology;  Laterality: Right;    Family History  Problem Relation Age of Onset   Breast cancer Mother 50   Diabetes Father    Hypertension Father    Heart disease Father    Prostate cancer Father    Ovarian cancer Sister    Lung cancer Sister    Rectal cancer Sister    Cancer Brother        Oral   Colon cancer Brother     Social History   Socioeconomic History   Marital status: Single    Spouse name: Not on file   Number of children: 2   Years of education: Not on file   Highest education level: 12th grade  Occupational History   Not on file  Tobacco Use   Smoking status: Never   Smokeless tobacco: Never  Vaping Use   Vaping status: Never Used  Substance and Sexual Activity   Alcohol use: Not Currently    Comment: rarely   Drug use: No  Sexual activity: Never    Birth control/protection: Pill  Other Topics Concern   Not on file  Social History Narrative   Single.   2 children, 1 grandchildren.   Works in the assembly    Enjoys relaxing.      [name] (42)   [name] (37)   Social Drivers of Health   Financial Resource Strain: Low Risk  (10/15/2023)   Overall Financial Resource Strain (CARDIA)    Difficulty of Paying Living Expenses: Not hard at all  Food Insecurity: No Food Insecurity (10/15/2023)   Hunger Vital Sign    Worried About Running Out of Food in the Last Year: Never true    Ran Out of Food in the Last Year: Never true  Transportation Needs: No Transportation Needs (10/15/2023)   PRAPARE - Administrator, Civil Service (Medical): No    Lack of Transportation (Non-Medical): No  Physical Activity: Unknown (10/15/2023)    Exercise Vital Sign    Days of Exercise per Week: 0 days    Minutes of Exercise per Session: Not on file  Stress: No Stress Concern Present (10/15/2023)   [employer] of Occupational Health - Occupational Stress Questionnaire    Feeling of Stress : Not at all  Social Connections: Socially Isolated (10/15/2023)   Social Connection and Isolation Panel [NHANES]    Frequency of Communication with Friends and Family: Once a week    Frequency of Social Gatherings with Friends and Family: Once a week    Attends Religious Services: Never    Database administrator or Organizations: No    Attends Engineer, structural: Not on file    Marital Status: Never married  Catering manager Violence: Not on file   Review of Systems No back pain No fever No cough or SOB    Objective:   Physical Exam Constitutional:      Appearance: Normal appearance.  Pulmonary:     Effort: Pulmonary effort is normal.     Breath sounds: Normal breath sounds. No wheezing or rales.  Abdominal:     Palpations: Abdomen is soft.     Tenderness: There is no guarding or rebound.     Comments: Decreased bowel sounds Some very low LLQ tenderness (by inguinal area)---not on deep palpation higher (like where sigmoid is)  Neurological:     Mental Status: She is alert.            Assessment & Plan:

## 2023-12-17 NOTE — Assessment & Plan Note (Addendum)
 Symptoms not consistent with upper pathology like ulcer or gallbladder disease No diverticuli on colonoscopy and not typical of that Will check urinalysis to be sure no occult UTI Likely obstipation  Urinalysis negative for nitrite/leukocytes  Will give bowel regimen---written for her If pain gets worse, to ER to consider CT scan

## 2023-12-17 NOTE — Patient Instructions (Signed)
 Please increase the miralax  to 17 grams in a full glass of water three to four times a day. Also add senna-s 2 tabs twice a day If you haven't cleared out within 2 days--you can try dulcolax again If your pain gets worse, go the the ER for further evaluation

## 2024-01-11 ENCOUNTER — Other Ambulatory Visit: Payer: Self-pay | Admitting: Nurse Practitioner

## 2024-01-29 ENCOUNTER — Ambulatory Visit: Admitting: Nurse Practitioner

## 2024-01-29 VITALS — BP 138/84 | HR 85 | Temp 98.2°F | Ht 62.5 in | Wt 331.0 lb

## 2024-01-29 DIAGNOSIS — Z6841 Body Mass Index (BMI) 40.0 and over, adult: Secondary | ICD-10-CM

## 2024-01-29 DIAGNOSIS — E119 Type 2 diabetes mellitus without complications: Secondary | ICD-10-CM

## 2024-01-29 DIAGNOSIS — Z7985 Long-term (current) use of injectable non-insulin antidiabetic drugs: Secondary | ICD-10-CM

## 2024-01-29 LAB — POCT GLYCOSYLATED HEMOGLOBIN (HGB A1C): Hemoglobin A1C: 5.3 % (ref 4.0–5.6)

## 2024-01-29 NOTE — Assessment & Plan Note (Signed)
 Patient currently maintained on Wegovy  2.4 mg weekly.  Tolerating the medication decently well with intermittent constipation that she can relieve with over-the-counter use of medications.  Patient is down approximately total 30 pounds.  She is working on her dietary changes.  She recently started working out.  Continue work on healthy lifestyle modifications inclusive of diet and exercise.

## 2024-01-29 NOTE — Assessment & Plan Note (Signed)
 Patient is A1c 5.3% today.  Last A1c was 5.9%.  Continue lifestyle modifications.  Continue working on losing weight

## 2024-01-29 NOTE — Progress Notes (Signed)
 Established Patient Office Visit  Subjective   Patient ID: Stacy Meyer, female    DOB: [birth year]  Age: 63 y.o. MRN: [id number]  Chief Complaint  Patient presents with   Weight Check    HPI  Obesity: Patient is currently maintained on Wegovy  2.4 mg weekly. Patient was last seen by me on 10/23/2023.  Patient was originally started on 07/17/2023 with Wegovy  and at last office visit had lost approximately 20 pounds.  She is handling medication decently well with some constipation.  Does note she was seen by colleague Charlie Denise on 12/17/2023 for lower abdominal pain.  Increase MiraLAX  to 17 g 3-4 times a day also add in senna S to 2 tabs twice a day if she had not within 2 days he recommend tracking Dulcolax again.  Patient is here for follow-up  States that she is doing bloom extra fibers. States that if she does not have a movemnet in 3 days she will miralax  and senna kot States that sometimes she eat and somedays she has to make herself eat. On those days she will eat once. States that now she will do smaller meals in a day She is drinking water and a cold coffee on occasion and ginger ale.   States that her and her son just atarted working out she got two days in last week. States that this week she has not done much because of the heat. Weight training and waking a mile   States she is almost down 30 pounds     Review of Systems  Constitutional:  Negative for chills and fever.  Respiratory:  Negative for shortness of breath.   Cardiovascular:  Negative for chest pain.  Gastrointestinal:  Positive for constipation. Negative for abdominal pain, diarrhea, nausea and vomiting.  Neurological:  Negative for dizziness and headaches.      Objective:     BP 138/84   Pulse 85   Temp 98.2 F (36.8 C) (Oral)   Ht 5' 2.5 (1.588 m)   Wt (!) 331 lb (150.1 kg)   LMP 09/20/2011   SpO2 96%   BMI 59.58 kg/m  BP Readings from Last 3 Encounters:  01/29/24 138/84  12/17/23  136/84  10/23/23 (!) 140/90   Wt Readings from Last 3 Encounters:  01/29/24 (!) 331 lb (150.1 kg)  12/17/23 (!) 338 lb (153.3 kg)  10/23/23 (!) 340 lb 9.6 oz (154.5 kg)   SpO2 Readings from Last 3 Encounters:  01/29/24 96%  12/17/23 96%  10/23/23 96%      Physical Exam Vitals and nursing note reviewed.  Constitutional:      Appearance: Normal appearance.   Cardiovascular:     Rate and Rhythm: Normal rate and regular rhythm.     Heart sounds: Normal heart sounds.  Pulmonary:     Effort: Pulmonary effort is normal.     Breath sounds: Normal breath sounds.  Abdominal:     General: There is no distension.     Palpations: There is no mass.     Tenderness: There is no abdominal tenderness.     Hernia: No hernia is present.     Comments: BS WNL   Neurological:     Mental Status: She is alert.      Results for orders placed or performed in visit on 01/29/24  POCT glycosylated hemoglobin (Hb A1C)  Result Value Ref Range   Hemoglobin A1C 5.3 4.0 - 5.6 %   HbA1c POC (<> result, manual entry)  HbA1c, POC (prediabetic range)     HbA1c, POC (controlled diabetic range)        The 10-year ASCVD risk score (Arnett DK, et al., 2019) is: 13.5%    Assessment & Plan:   Problem List Items Addressed This Visit       Other   Morbid obesity (HCC)   Patient currently maintained on Wegovy  2.4 mg weekly.  Tolerating the medication decently well with intermittent constipation that she can relieve with over-the-counter use of medications.  Patient is down approximately total 30 pounds.  She is working on her dietary changes.  She recently started working out.  Continue work on healthy lifestyle modifications inclusive of diet and exercise.      Well controlled diabetes mellitus (HCC) - Primary   Patient is A1c 5.3% today.  Last A1c was 5.9%.  Continue lifestyle modifications.  Continue working on losing weight      Relevant Orders   POCT glycosylated hemoglobin (Hb A1C)  (Completed)    Return in about 3 months (around 04/30/2024) for weight/wegovy .    Adina Crandall, NP

## 2024-01-29 NOTE — Patient Instructions (Signed)
 Nice to see you today Your A1C was 5.3% We will continue the wegovy  at the 2.4mg  dose Follow up with me in 3 months, sooner if you need me  Continue working on your exercise

## 2024-02-08 ENCOUNTER — Other Ambulatory Visit: Payer: Self-pay | Admitting: Nurse Practitioner

## 2024-02-26 ENCOUNTER — Encounter: Payer: Self-pay | Admitting: Gastroenterology

## 2024-03-11 ENCOUNTER — Ambulatory Visit
Admission: RE | Admit: 2024-03-11 | Discharge: 2024-03-11 | Disposition: A | Discharge status: Home / Self Care | Attending: Gastroenterology | Admitting: Gastroenterology

## 2024-03-11 ENCOUNTER — Encounter: Payer: Self-pay | Admitting: Gastroenterology

## 2024-03-11 ENCOUNTER — Encounter
Admission: RE | Disposition: A | Discharge status: Home / Self Care | Payer: Self-pay | Source: Home / Self Care | Attending: Gastroenterology

## 2024-03-11 DIAGNOSIS — Z09 Encounter for follow-up examination after completed treatment for conditions other than malignant neoplasm: Secondary | ICD-10-CM | POA: Diagnosis not present

## 2024-03-11 DIAGNOSIS — Z860100 Personal history of colon polyps, unspecified: Secondary | ICD-10-CM | POA: Insufficient documentation

## 2024-03-11 DIAGNOSIS — Z539 Procedure and treatment not carried out, unspecified reason: Secondary | ICD-10-CM | POA: Diagnosis not present

## 2024-03-11 HISTORY — DX: Benign neoplasm of ascending colon: D12.2

## 2024-03-11 HISTORY — DX: Allergic rhinitis, unspecified: J30.9

## 2024-03-11 HISTORY — PX: COLONOSCOPY: SHX5424

## 2024-03-11 SURGERY — COLONOSCOPY
Anesthesia: General

## 2024-03-11 NOTE — H&P (Signed)
 GI Care note  Pt had vomited up most of her prep and Bms were still liquid brown. Given phx of advanced polyps and strong fhx of crc, procedure will be rescheduled to allow for adequate prep.  Stacy EMERSON Jungling, DO [facility]

## 2024-04-19 ENCOUNTER — Ambulatory Visit: Payer: Self-pay | Admitting: *Deleted

## 2024-04-19 NOTE — Telephone Encounter (Signed)
 Noted. If she is concerned for low blood sugar if she is due the wegovy  shot she can hold that dose until she sees me

## 2024-04-19 NOTE — Telephone Encounter (Signed)
Noted.  I will see her in office as scheduled.

## 2024-04-19 NOTE — Telephone Encounter (Signed)
 Called pt.  Pt is not due for wegovy  shot until Sunday.  States she feels lightheaded but is doing okay.  States bp was 155/99 @12pm  today.

## 2024-04-19 NOTE — Telephone Encounter (Signed)
 FYI Only or Action Required?: FYI only for provider.  Patient was last seen in primary care on 01/29/2024 by Wendee Lynwood HERO, NP.  Called Nurse Triage reporting Hypertension.  Symptoms began a week ago.  Interventions attempted: Rest, hydration, or home remedies.  Symptoms are: gradually worsening.  Triage Disposition: See Physician Within 24 Hours  Patient/caregiver understands and will follow disposition?: Yes            Copied from CRM [id number]. Topic: Clinical - Red Word Triage >> Apr 19, 2024  9:13 AM Mia F wrote: Red Word that prompted transfer to Nurse Triage: Sugar levels had dropped to 80 and blood pressure had went up to 177/93. She was able to get her sugar levels up to 90 but she still is feeling bad. PT says she is feeling light headed and she felt nausous when moving. Also experiencing some stomach pain. Has been going on for the last two days. Not experiencing any other symptoms. Reason for Disposition  Systolic BP >= 180 OR Diastolic >= 110  Answer Assessment - Initial Assessment Questions Appt scheduled for 04/21/24 per patient request. Patient unable to come in any earlier due to work and concerned about losing job. Elevated BP with no sx at this time but does have concerns with blood sugar being lower than normal feeling tired and nausea in am upon awakening. Reports thirsty all of the time, episodes of dizziness on and off more than a week. Recommended if sx noted or BP elevated call back or go to UC/ED.      1. BLOOD PRESSURE: What is your blood pressure? Did you take at least two measurements 5 minutes apart?     BP 177/101. Rechecked BP 181/105 no sx  2. ONSET: When did you take your blood pressure?     This am  3. HOW: How did you take your blood pressure? (e.g., automatic home BP monitor, visiting nurse)     Wrist at home monitor  4. HISTORY: Do you have a history of high blood pressure?     Yes but not taking medication  5. MEDICINES:  Are you taking any medicines for blood pressure? Have you missed any doses recently?     No  6. OTHER SYMPTOMS: Do you have any symptoms? (e.g., blurred vision, chest pain, difficulty breathing, headache, weakness)     No chest pain no difficulty breathing , tingling right hand and feet at times not now . Dizziness at times not now . Feels tired all of the time. Low blood sugars in am this am 105 normal 120's. Nausea in am.  7. PREGNANCY: Is there any chance you are pregnant? When was your last menstrual period?     na  Protocols used: Blood Pressure - High-A-AH

## 2024-04-21 ENCOUNTER — Ambulatory Visit: Admitting: Nurse Practitioner

## 2024-04-21 VITALS — BP 150/92 | HR 80 | Temp 97.9°F | Ht 62.5 in | Wt 318.4 lb

## 2024-04-21 DIAGNOSIS — R110 Nausea: Secondary | ICD-10-CM

## 2024-04-21 DIAGNOSIS — I10 Essential (primary) hypertension: Secondary | ICD-10-CM

## 2024-04-21 MED ORDER — VALSARTAN 80 MG PO TABS: 80.0000 mg | ORAL_TABLET | Freq: Every day | ORAL | 0 refills | Status: DC

## 2024-04-21 NOTE — Patient Instructions (Signed)
 Nice to see you today I have sent the blood pressure medication to the pharmacy  Follow up with me in 1 month, sooner if you need me  EKG looked ok in office today

## 2024-04-21 NOTE — Progress Notes (Signed)
 Acute Office Visit  Subjective:     Patient ID: Stacy Meyer, female    DOB: 1961/01/02, 63 y.o.   MRN: [id number]  Chief Complaint  Patient presents with   Hypertension    Pt most recent BP readings on Thursday: 149/97 at 5am and 153/100 at 10am. Pt reports feeling nauseous, hazy and pain behind eyes.      Discussed the use of AI scribe software for clinical note transcription with the patient, who gave verbal consent to proceed.  History of Present Illness Stacy Meyer is a 63 year old female with hypertension who presents with elevated blood pressure and associated symptoms.  She has experienced elevated blood pressure readings over the past two weeks, beginning after taking her Wegovy  shot. Her blood pressure was 181/110 on the Monday following the shot, accompanied by nausea and dizziness, which led her to stay home from work. She continues to monitor her blood pressure, which remains elevated, with readings such as 154/90. She experiences nausea and dizziness each morning before work, prompting her to check her blood pressure, which is consistently high.  She has a history of hypertension and was previously on medication, but her blood pressure had been stable until recently. Her blood pressure would occasionally spike after consuming salty or fried foods but would return to normal after increasing her fluid intake. No chest pain or shortness of breath, but she reports dizziness and blurred vision when her blood pressure is elevated. She also experiences pressure behind her eyes, mainly the right one, which she alleviates by holding it shut.  Regarding her blood sugar, it has been low in the mornings, around 90-91, which she does not find concerning. However, when it drops below 90, she feels 'a little funny' and resolves this by eating something. She eats three meals a day and sometimes snacks in between. She aims not to eat past 7 PM but occasionally forgets to eat  before bed, which she believes affects her blood sugar levels.  She has been on Wegovy  for some time and experiences nausea after taking her weekly dose on Sundays. She sometimes pushes through the nausea to go to work but feels nauseous throughout the day. She previously took amlodipine  and losartan  for blood pressure but experienced side effects such as fluid retention and swelling.   Review of Systems  Constitutional:  Negative for chills and fever.  Respiratory:  Negative for shortness of breath.   Cardiovascular:  Negative for chest pain.  Gastrointestinal:  Positive for nausea. Negative for abdominal pain, diarrhea and vomiting.  Neurological:  Positive for headaches (pressure behind the eyes).  Psychiatric/Behavioral:  Negative for hallucinations and suicidal ideas.         Objective:    BP (!) 150/92   Pulse 80   Temp 97.9 F (36.6 C) (Oral)   Ht 5' 2.5 (1.588 m)   Wt (!) 318 lb 6.4 oz (144.4 kg)   LMP 09/20/2011   SpO2 97%   BMI 57.31 kg/m  BP Readings from Last 3 Encounters:  04/21/24 (!) 150/92  01/29/24 138/84  12/17/23 136/84   Wt Readings from Last 3 Encounters:  04/21/24 (!) 318 lb 6.4 oz (144.4 kg)  01/29/24 (!) 331 lb (150.1 kg)  12/17/23 (!) 338 lb (153.3 kg)   SpO2 Readings from Last 3 Encounters:  04/21/24 97%  01/29/24 96%  12/17/23 96%      Physical Exam Vitals and nursing note reviewed.  Constitutional:  Appearance: Normal appearance.  Cardiovascular:     Rate and Rhythm: Normal rate and regular rhythm.     Heart sounds: Normal heart sounds.  Pulmonary:     Effort: Pulmonary effort is normal.     Breath sounds: Normal breath sounds.  Abdominal:     General: Bowel sounds are normal. There is no distension.     Palpations: There is no mass.     Tenderness: There is no abdominal tenderness.     Hernia: No hernia is present.  Neurological:     Mental Status: She is alert.     No results found for any visits on 04/21/24.       Assessment & Plan:   Problem List Items Addressed This Visit       Cardiovascular and Mediastinum   Hypertension, essential - Primary   Relevant Medications   valsartan  (DIOVAN ) 80 MG tablet   Other Relevant Orders   EKG 12-Lead   CBC   Comprehensive metabolic panel with GFR     Other   Nausea   Relevant Orders   EKG 12-Lead   Lipase   Assessment and Plan Assessment & Plan Hypertension with associated dizziness Hypertension with dizziness, likely due to elevated blood pressure. Previous medications not tolerated. Valsartan  chosen for its protective benefits. - Initiate valsartan  80 mg daily. - Monitor blood pressure at home. - Order EKG. - Order blood work including lipase and liver function tests. - Follow up in one month to reassess blood pressure, kidney function, and potassium levels.  Nausea, likely medication-related Nausea likely related to Wegovy  injections, contributing to elevated blood pressure. - Monitor symptoms and consider dose reduction of Wegovy  if nausea persists and blood pressure is controlled.  Morbid obesity Morbid obesity managed with Wegovy . Nausea post-injection may affect blood pressure. Discussed potential switch to Mounjaro. - Continue Wegovy  2.4 mg subcutaneous weekly. - Consider dose reduction of Wegovy  if nausea persists and blood pressure is controlled. - Discuss potential switch to zepbound if insurance coverage allows.  Type 2 diabetes mellitus with mild hypoglycemia Type 2 diabetes well controlled with occasional mild hypoglycemia. - Continue monitoring blood sugar levels, especially in the morning. - Encourage regular meals and consider a protein snack before bedtime to prevent nocturnal hypoglycemia.  Meds ordered this encounter  Medications   valsartan  (DIOVAN ) 80 MG tablet    Sig: Take 1 tablet (80 mg total) by mouth daily.    Dispense:  90 tablet    Refill:  0    Supervising Provider:   RANDEEN HARDY A [[id number]]    Return in  about 4 weeks (around 05/19/2024) for BP recheck, BMP.  Adina Crandall, NP

## 2024-04-22 ENCOUNTER — Ambulatory Visit: Payer: Self-pay | Admitting: Nurse Practitioner

## 2024-04-22 LAB — CBC
HCT: 41.2 % (ref 36.0–46.0)
Hemoglobin: 13.2 g/dL (ref 12.0–15.0)
MCHC: 32.1 g/dL (ref 30.0–36.0)
MCV: 85.3 fl (ref 78.0–100.0)
Platelets: 360 K/uL (ref 150.0–400.0)
RBC: 4.83 Mil/uL (ref 3.87–5.11)
RDW: 14.2 % (ref 11.5–15.5)
WBC: 5.1 K/uL (ref 4.0–10.5)

## 2024-04-22 LAB — COMPREHENSIVE METABOLIC PANEL WITH GFR
ALT: 5 U/L (ref 0–35)
AST: 12 U/L (ref 0–37)
Albumin: 3.6 g/dL (ref 3.5–5.2)
Alkaline Phosphatase: 60 U/L (ref 39–117)
BUN: 12 mg/dL (ref 6–23)
CO2: 29 meq/L (ref 19–32)
Calcium: 9 mg/dL (ref 8.4–10.5)
Chloride: 105 meq/L (ref 96–112)
Creatinine, Ser: 0.95 mg/dL (ref 0.40–1.20)
GFR: 64.9 mL/min (ref >60.00)
Glucose, Bld: 90 mg/dL (ref 70–99)
Potassium: 3.7 meq/L (ref 3.5–5.1)
Sodium: 140 meq/L (ref 135–145)
Total Bilirubin: 0.5 mg/dL (ref 0.2–1.2)
Total Protein: 6.7 g/dL (ref 6.0–8.3)

## 2024-04-22 LAB — LIPASE: Lipase: 27 U/L (ref 11.0–59.0)

## 2024-04-25 ENCOUNTER — Encounter: Payer: Self-pay | Admitting: Nurse Practitioner

## 2024-04-29 ENCOUNTER — Ambulatory Visit: Admitting: Nurse Practitioner

## 2024-05-03 ENCOUNTER — Other Ambulatory Visit: Payer: Self-pay | Admitting: Nurse Practitioner

## 2024-05-04 ENCOUNTER — Telehealth: Payer: Self-pay

## 2024-05-04 ENCOUNTER — Other Ambulatory Visit (HOSPITAL_COMMUNITY): Payer: Self-pay

## 2024-05-04 NOTE — Telephone Encounter (Signed)
 Pharmacy Patient Advocate Encounter   Received notification from Onbase that prior authorization for Wegovy  2.4 is required/requested.   Insurance verification completed.   The patient is insured through [facility].   Per test claim: PA required; PA submitted to above mentioned insurance via Latent Key/confirmation #/EOC [id number] Status is pending

## 2024-05-04 NOTE — Telephone Encounter (Signed)
 Pharmacy Patient Advocate Encounter  Received notification from [facility] that Prior Authorization for Wegovy  2.4 has been APPROVED from 05/04/24 to 05/04/25. Ran test claim, Copay is $24.99. This test claim was processed through [facility]- copay amounts may vary at other pharmacies due to pharmacy/plan contracts, or as the patient moves through the different stages of their insurance plan.   PA #/Case ID/Reference #: # [id number]

## 2024-05-20 ENCOUNTER — Encounter
Admission: RE | Disposition: A | Discharge status: Home / Self Care | Payer: Self-pay | Source: Home / Self Care | Attending: Gastroenterology

## 2024-05-20 ENCOUNTER — Ambulatory Visit
Admission: RE | Admit: 2024-05-20 | Discharge: 2024-05-20 | Disposition: A | Discharge status: Home / Self Care | Attending: Gastroenterology | Admitting: Gastroenterology

## 2024-05-20 ENCOUNTER — Other Ambulatory Visit: Payer: Self-pay

## 2024-05-20 ENCOUNTER — Encounter: Payer: Self-pay | Admitting: Gastroenterology

## 2024-05-20 ENCOUNTER — Ambulatory Visit: Admitting: Anesthesiology

## 2024-05-20 DIAGNOSIS — Z6841 Body Mass Index (BMI) 40.0 and over, adult: Secondary | ICD-10-CM | POA: Insufficient documentation

## 2024-05-20 DIAGNOSIS — K635 Polyp of colon: Secondary | ICD-10-CM | POA: Diagnosis not present

## 2024-05-20 DIAGNOSIS — D179 Benign lipomatous neoplasm, unspecified: Secondary | ICD-10-CM | POA: Diagnosis not present

## 2024-05-20 DIAGNOSIS — E119 Type 2 diabetes mellitus without complications: Secondary | ICD-10-CM | POA: Diagnosis not present

## 2024-05-20 DIAGNOSIS — D122 Benign neoplasm of ascending colon: Secondary | ICD-10-CM | POA: Diagnosis not present

## 2024-05-20 DIAGNOSIS — K649 Unspecified hemorrhoids: Secondary | ICD-10-CM | POA: Diagnosis not present

## 2024-05-20 DIAGNOSIS — J45909 Unspecified asthma, uncomplicated: Secondary | ICD-10-CM | POA: Diagnosis not present

## 2024-05-20 DIAGNOSIS — I10 Essential (primary) hypertension: Secondary | ICD-10-CM | POA: Diagnosis not present

## 2024-05-20 DIAGNOSIS — D175 Benign lipomatous neoplasm of intra-abdominal organs: Secondary | ICD-10-CM | POA: Diagnosis not present

## 2024-05-20 DIAGNOSIS — K5730 Diverticulosis of large intestine without perforation or abscess without bleeding: Secondary | ICD-10-CM | POA: Diagnosis not present

## 2024-05-20 DIAGNOSIS — D124 Benign neoplasm of descending colon: Secondary | ICD-10-CM | POA: Insufficient documentation

## 2024-05-20 DIAGNOSIS — Z800 Family history of malignant neoplasm of digestive organs: Secondary | ICD-10-CM | POA: Diagnosis not present

## 2024-05-20 DIAGNOSIS — Z860101 Personal history of adenomatous and serrated colon polyps: Secondary | ICD-10-CM | POA: Diagnosis not present

## 2024-05-20 DIAGNOSIS — Z1211 Encounter for screening for malignant neoplasm of colon: Secondary | ICD-10-CM | POA: Diagnosis not present

## 2024-05-20 HISTORY — PX: COLONOSCOPY: SHX5424

## 2024-05-20 SURGERY — COLONOSCOPY
Anesthesia: General

## 2024-05-20 MED ORDER — PROPOFOL 500 MG/50ML IV EMUL
INTRAVENOUS | Status: DC | PRN
  Administered 2024-05-20: 75 ug/kg/min via INTRAVENOUS

## 2024-05-20 MED ORDER — LIDOCAINE HCL (PF) 2 % IJ SOLN
INTRAMUSCULAR | Status: AC
  Filled 2024-05-20: qty 10

## 2024-05-20 MED ORDER — SODIUM CHLORIDE 0.9 % IV SOLN: INTRAVENOUS | Status: DC

## 2024-05-20 MED ORDER — DEXMEDETOMIDINE HCL IN NACL 80 MCG/20ML IV SOLN
INTRAVENOUS | Status: DC | PRN
  Administered 2024-05-20: 12 ug via INTRAVENOUS
  Administered 2024-05-20: 8 ug via INTRAVENOUS

## 2024-05-20 MED ORDER — PROPOFOL 10 MG/ML IV BOLUS
INTRAVENOUS | Status: DC | PRN
  Administered 2024-05-20 (×2): 50 mg via INTRAVENOUS

## 2024-05-20 MED ORDER — LIDOCAINE HCL (CARDIAC) PF 100 MG/5ML IV SOSY
PREFILLED_SYRINGE | INTRAVENOUS | Status: DC | PRN
  Administered 2024-05-20: 80 mg via INTRAVENOUS

## 2024-05-20 NOTE — H&P (Signed)
 Pre-Procedure H&P   Patient ID: Stacy Meyer is a 63 y.o. female.  Gastroenterology Provider: [name], DO  Referring Provider: Lynwood Crandall, NP PCP: Crandall Lynwood HERO, NP  Date: 05/20/2024  HPI [name] is a 63 y.o. female who presents today for Colonoscopy for Personal history of colon polyps, family history of colon cancer .  Patient's brother and sister with colon cancer at age 56 and 64 respectively.  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  She had fair prep on each of these 3 colonoscopies.  Reports every other day bowel movement without melena or hematochezia  Patient's Wegovy  has been held for the procedure   Past Medical History:  Diagnosis Date   Adenomatous polyp of ascending colon    Allergic rhinitis    Anemia 02/2009   Asthma    Blood transfusion 2011   [place]   Diabetes mellitus without complication (HCC)    Hypertension    Morbid obesity (HCC)     Past Surgical History:  Procedure Laterality Date   CARPAL TUNNEL RELEASE     COLONOSCOPY N/A 03/11/2024   Procedure: COLONOSCOPY;  Surgeon: Jungling Elspeth Ozell, DO;  Location: [facility] END[facility]COPY;  Service: Gastroenterology;  Laterality: N/A;  DM on Wegovy    COLONOSCOPY WITH PROPOFOL  N/A 10/10/2016   Procedure: COLONOSCOPY WITH PROPOFOL ;  Surgeon: Ruel Kung, MD;  Location: [facility] ENDOSCOPY;  Service: Endoscopy;  Laterality: N/A;   ESOPHAGOGASTRODOUDENOSCOPY N/A    EXCISION OF GANGLION CYST     TUBAL LIGATION  1988   VAGINAL HYSTERECTOMY  12/01/2011   Procedure: HYSTERECTOMY VAGINAL;  Surgeon: Curlee VEAR Guan, MD;  Location: [facility];  Service: Gynecology;  Laterality: Right;    Family History Brother and sister with crc No other h/o GI disease or malignancy  Review of Systems  Constitutional:  Negative for activity change, appetite change, chills, diaphoresis, fatigue, fever and unexpected weight change.  HENT:  Negative for  trouble swallowing and voice change.   Respiratory:  Negative for shortness of breath and wheezing.   Cardiovascular:  Negative for chest pain, palpitations and leg swelling.  Gastrointestinal:  Negative for abdominal distention, abdominal pain, anal bleeding, blood in stool, constipation, diarrhea, nausea, rectal pain and vomiting.  Musculoskeletal:  Negative for arthralgias and myalgias.  Skin:  Negative for color change and pallor.  Neurological:  Negative for dizziness, syncope and weakness.  Psychiatric/Behavioral:  Negative for confusion.   All other systems reviewed and are negative.    Medications No current facility-administered medications on file prior to encounter.   Current Outpatient Medications on File Prior to Encounter  Medication Sig Dispense Refill   albuterol  (VENTOLIN  HFA) 108 (90 Base) MCG/ACT inhaler Inhale 1-2 puffs into the lungs every 6 (six) hours as needed for wheezing or shortness of breath. 8 g 1   fluticasone -salmeterol (ADVAIR) 100-50 MCG/ACT AEPB Inhale 1 puff into the lungs 2 (two) times daily. 1 each 3   valsartan  (DIOVAN ) 80 MG tablet Take 1 tablet (80 mg total) by mouth daily. 90 tablet 0   glucose blood (CONTOUR NEXT TEST) test strip Use to check blood sugar once a day 100 each 12   Lancets MISC 1 each by Does not apply route daily. Use to obtain blood sugar sample once a day 100 each 3   Probiotic Product (PROBIOTIC PO) Take by mouth.     semaglutide -weight management (WEGOVY ) 2.4 MG/0.75ML SOAJ SQ injection INJECT 2.4 MG INTO THE SKIN  ONCE A WEEK. 9 mL 0    Pertinent medications related to GI and procedure were reviewed by me with the patient prior to the procedure   Current Facility-Administered Medications:    0.9 %  sodium chloride  infusion, , Intravenous, Continuous, Onita Elspeth Sharper, DO, Last Rate: 20 mL/hr at 05/20/24 1248, New Bag at 05/20/24 1248  sodium chloride  20 mL/hr at 05/20/24 1248       No Known Allergies Allergies were  reviewed by me prior to the procedure  Objective   Body mass index is 53.93 kg/m. Vitals:   05/20/24 1234  BP: (!) 144/89  Pulse: 76  Temp: 97.6 F (36.4 C)  TempSrc: Temporal  SpO2: 100%  Weight: (!) 142.5 kg  Height: 5' 4 (1.626 m)     Physical Exam Vitals and nursing note reviewed.  Constitutional:      General: She is not in acute distress.    Appearance: Normal appearance. She is obese. She is not ill-appearing, toxic-appearing or diaphoretic.  HENT:     Head: Normocephalic and atraumatic.     Nose: Nose normal.     Mouth/Throat:     Mouth: Mucous membranes are moist.     Pharynx: Oropharynx is clear.  Eyes:     General: No scleral icterus.    Extraocular Movements: Extraocular movements intact.  Cardiovascular:     Rate and Rhythm: Normal rate and regular rhythm.     Heart sounds: Normal heart sounds. No murmur heard.    No friction rub. No gallop.  Pulmonary:     Effort: Pulmonary effort is normal. No respiratory distress.     Breath sounds: Normal breath sounds. No wheezing, rhonchi or rales.  Abdominal:     General: Bowel sounds are normal. There is no distension.     Palpations: Abdomen is soft.     Tenderness: There is no abdominal tenderness. There is no guarding or rebound.  Musculoskeletal:     Cervical back: Neck supple.     Right lower leg: No edema.     Left lower leg: No edema.  Skin:    General: Skin is warm and dry.     Coloration: Skin is not jaundiced or pale.  Neurological:     General: No focal deficit present.     Mental Status: She is alert and oriented to person, place, and time. Mental status is at baseline.  Psychiatric:        Mood and Affect: Mood normal.        Behavior: Behavior normal.        Thought Content: Thought content normal.        Judgment: Judgment normal.      Assessment:  [name] is a 63 y.o. female  who presents today for Colonoscopy for Personal history of colon polyps, family history of  colon cancer .  Plan:  Colonoscopy with possible intervention today  Colonoscopy with possible biopsy, control of bleeding, polypectomy, and interventions as necessary has been discussed with the patient/patient representative. Informed consent was obtained from the patient/patient representative after explaining the indication, nature, and risks of the procedure including but not limited to death, bleeding, perforation, missed neoplasm/lesions, cardiorespiratory compromise, and reaction to medications. Opportunity for questions was given and appropriate answers were provided. Patient/patient representative has verbalized understanding is amenable to undergoing the procedure.   Elspeth Sharper Onita, DO  [facility]  Portions of the record may have been created with voice recognition software.  Occasional wrong-word or 'sound-a-like' substitutions may have occurred due to the inherent limitations of voice recognition software.  Read the chart carefully and recognize, using context, where substitutions may have occurred.

## 2024-05-20 NOTE — Interval H&P Note (Signed)
 History and Physical Interval Note: Preprocedure H&P from 05/20/24  was reviewed and there was no interval change after seeing and examining the patient.  Written consent was obtained from the patient after discussion of risks, benefits, and alternatives. Patient has consented to proceed with Colonoscopy with possible intervention   05/20/2024 1:07 PM  Stacy Meyer  has presented today for surgery, with the diagnosis of History of adenomatous polyp of colon [Z86.0101].  The various methods of treatment have been discussed with the patient and family. After consideration of risks, benefits and other options for treatment, the patient has consented to  Procedure(s) with comments: COLONOSCOPY (N/A) - Wegovy  as a surgical intervention.  The patient's history has been reviewed, patient examined, no change in status, stable for surgery.  I have reviewed the patient's chart and labs.  Questions were answered to the patient's satisfaction.     [name]

## 2024-05-20 NOTE — Op Note (Signed)
 [facility] Patient Name: Stacy Meyer Procedure Date: 05/20/2024 1:09 PM MRN: [id number] Account #: [id number] Date of Birth: 02-Mar-1961 Admit Type: Outpatient Age: 63 Room: [facility] ENDO ROOM 1 Gender: Female Note Status: Finalized Instrument Name: Colon Scope [id number] Procedure:             Colonoscopy Indications:           High risk colon cancer surveillance: Personal history                         of adenoma (10 mm or greater in size) Providers:             Elspeth Ozell Onita ROSALEA, DO Referring MD:          Lynwood HERO. Wendee (Referring MD) Medicines:             Family history of colon cancer. Monitored Anesthesia                         Care Complications:         No immediate complications. Estimated blood loss:                         Minimal. Procedure:             Pre-Anesthesia Assessment:                        - Prior to the procedure, a History and Physical was                         performed, and patient medications and allergies were                         reviewed. The patient is competent. The risks and                         benefits of the procedure and the sedation options and                         risks were discussed with the patient. All questions                         were answered and informed consent was obtained.                         Patient identification and proposed procedure were                         verified by the physician, the nurse, the anesthetist                         and the technician in the endoscopy suite. Mental                         Status Examination: alert and oriented. Airway                         Examination: normal oropharyngeal airway and neck  mobility. Respiratory Examination: clear to                         auscultation. CV Examination: RRR, no murmurs, no S3                         or S4. Prophylactic Antibiotics: The patient does not                          require prophylactic antibiotics. Prior                         Anticoagulants: The patient has taken no anticoagulant                         or antiplatelet agents. ASA Grade Assessment: III - A                         patient with severe systemic disease. After reviewing                         the risks and benefits, the patient was deemed in                         satisfactory condition to undergo the procedure. The                         anesthesia plan was to use monitored anesthesia care                         (MAC). Immediately prior to administration of                         medications, the patient was re-assessed for adequacy                         to receive sedatives. The heart rate, respiratory                         rate, oxygen saturations, blood pressure, adequacy of                         pulmonary ventilation, and response to care were                         monitored throughout the procedure. The physical                         status of the patient was re-assessed after the                         procedure.                        After obtaining informed consent, the colonoscope was                         passed under direct vision. Throughout the procedure,  the patient's blood pressure, pulse, and oxygen                         saturations were monitored continuously. The                         Colonoscope was introduced through the anus and                         advanced to the the terminal ileum, with                         identification of the appendiceal orifice and IC                         valve. The colonoscopy was performed without                         difficulty. The patient tolerated the procedure well.                         The quality of the bowel preparation was evaluated                         using the BBPS [facility] Bowel Preparation Scale) with                         scores of: Right Colon = 3,  Transverse Colon = 3 and                         Left Colon = 3 (entire mucosa seen well with no                         residual staining, small fragments of stool or opaque                         liquid). The total BBPS score equals 9. The terminal                         ileum, ileocecal valve, appendiceal orifice, and                         rectum were photographed. Findings:      The perianal and digital rectal examinations were normal. Pertinent       negatives include normal sphincter tone.      The terminal ileum appeared normal. Estimated blood loss: none.      Retroflexion in the right colon was performed.      There was a medium-sized lipoma, in the rectum. + pillow sign. Estimated       blood loss: none.      Two sessile polyps were found in the descending colon. The polyps were 1       to 2 mm in size. These polyps were removed with a jumbo cold forceps.       Resection and retrieval were complete. Estimated blood loss was minimal.      Four sessile polyps were found in the ascending colon. The polyps were 3  to 6 mm in size. These polyps were removed with a cold snare. Resection       and retrieval were complete. Estimated blood loss was minimal.      The exam was otherwise without abnormality on direct and retroflexion       views. Impression:            - The examined portion of the ileum was normal.                        - Medium-sized lipoma in the rectum.                        - Two 1 to 2 mm polyps in the descending colon,                         removed with a jumbo cold forceps. Resected and                         retrieved.                        - Four 3 to 6 mm polyps in the ascending colon,                         removed with a cold snare. Resected and retrieved.                        - The examination was otherwise normal on direct and                         retroflexion views. Recommendation:        - Patient has a contact number available for                          emergencies. The signs and symptoms of potential                         delayed complications were discussed with the patient.                         Return to normal activities tomorrow. Written                         discharge instructions were provided to the patient.                        - Discharge patient to home.                        - Resume previous diet.                        - Continue present medications.                        - No ibuprofen, naproxen, or other non-steroidal                         anti-inflammatory drugs for 5 days after polyp removal.                        -  Await pathology results.                        - Repeat colonoscopy for surveillance based on                         pathology results.                        - Return to referring physician as previously                         scheduled.                        - The findings and recommendations were discussed with                         the patient. Procedure Code(s):     --- Professional ---                        [phone], Colonoscopy, flexible; with removal of                         tumor(s), polyp(s), or other lesion(s) by snare                         technique                        [id number], 59, Colonoscopy, flexible; with biopsy, single                         or multiple Diagnosis Code(s):     --- Professional ---                        Z86.010, Personal history of colonic polyps                        D17.5, Benign lipomatous neoplasm of intra-abdominal                         organs                        D12.4, Benign neoplasm of descending colon                        D12.2, Benign neoplasm of ascending colon CPT copyright 2022 American Medical Association. All rights reserved. The codes documented in this report are preliminary and upon coder review may  be revised to meet current compliance requirements. Attending Participation:      I personally  performed the entire procedure. Elspeth Jungling, DO [name] DO, DO 05/20/2024 1:41:55 PM This report has been signed electronically. Number of Addenda: 0 Note Initiated On: 05/20/2024 1:09 PM Scope Withdrawal Time: 0 hours 15 minutes 16 seconds  Total Procedure Duration: 0 hours 20 minutes 4 seconds  Estimated Blood Loss:  Estimated blood loss was minimal.      [facility]

## 2024-05-20 NOTE — Transfer of Care (Signed)
 Immediate Anesthesia Transfer of Care Note  Patient: Stacy Meyer  Procedure(s) Performed: COLONOSCOPY  Patient Location: PACU  Anesthesia Type:General  Level of Consciousness: sedated  Airway & Oxygen Therapy: Patient Spontanous Breathing  Post-op Assessment: Report given to RN and Post -op Vital signs reviewed and stable  Post vital signs: Reviewed and stable  Last Vitals:  Vitals Value Taken Time  BP 118/79 05/20/24 13:41  Temp 35.8 C 05/20/24 13:41  Pulse 73 05/20/24 13:41  Resp 30 05/20/24 13:41  SpO2 100 % 05/20/24 13:41  Vitals shown include unfiled device data.  Last Pain:  Vitals:   05/20/24 1341  TempSrc: Temporal  PainSc:          Complications: No notable events documented.

## 2024-05-20 NOTE — Anesthesia Preprocedure Evaluation (Signed)
 Anesthesia Evaluation  Patient identified by MRN, date of birth, ID band Patient awake    Reviewed: Allergy & Precautions, H&P , NPO status , Patient's Chart, lab work & pertinent test results, reviewed documented beta blocker date and time   History of Anesthesia Complications (+) AWARENESS UNDER ANESTHESIANegative for: history of anesthetic complications  Airway Mallampati: II  TM Distance: >3 FB Neck ROM: full    Dental no notable dental hx.    Pulmonary neg pulmonary ROS, asthma    Pulmonary exam normal breath sounds clear to auscultation       Cardiovascular Exercise Tolerance: Good hypertension, negative cardio ROS  Rhythm:regular Rate:Normal     Neuro/Psych negative neurological ROS  negative psych ROS   GI/Hepatic negative GI ROS, Neg liver ROS,,,  Endo/Other  diabetes  Class 4 obesity  Renal/GU      Musculoskeletal   Abdominal   Peds  Hematology negative hematology ROS (+) Blood dyscrasia, anemia   Anesthesia Other Findings Past Medical History: 02/2009: Anemia No date: Asthma 2011: Blood transfusion     Comment: [address] No date: Morbid obesity (HCC)  Reproductive/Obstetrics negative OB ROS                              Anesthesia Physical Anesthesia Plan  ASA: 3  Anesthesia Plan: General   Post-op Pain Management: Minimal or no pain anticipated   Induction: Intravenous  PONV Risk Score and Plan: 2 and Propofol  infusion and TIVA  Airway Management Planned: Nasal Cannula  Additional Equipment: None  Intra-op Plan:   Post-operative Plan:   Informed Consent: I have reviewed the patients History and Physical, chart, labs and discussed the procedure including the risks, benefits and alternatives for the proposed anesthesia with the patient or authorized representative who has indicated his/her understanding and acceptance.     Dental advisory given  Plan  Discussed with: CRNA and Surgeon  Anesthesia Plan Comments: (Discussed risks of anesthesia with patient, including possibility of difficulty with spontaneous ventilation under anesthesia necessitating airway intervention, PONV, and rare risks such as cardiac or respiratory or neurological events, and allergic reactions. Discussed the role of CRNA in patient's perioperative care. Patient understands.)        Anesthesia Quick Evaluation

## 2024-05-20 NOTE — Anesthesia Postprocedure Evaluation (Signed)
 Anesthesia Post Note  Patient: Stacy Meyer  Procedure(s) Performed: COLONOSCOPY  Patient location during evaluation: Endoscopy Anesthesia Type: General Level of consciousness: awake and alert Pain management: pain level controlled Vital Signs Assessment: post-procedure vital signs reviewed and stable Respiratory status: spontaneous breathing, nonlabored ventilation, respiratory function stable and patient connected to nasal cannula oxygen Cardiovascular status: blood pressure returned to baseline and stable Postop Assessment: no apparent nausea or vomiting Anesthetic complications: no   No notable events documented.   Last Vitals:  Vitals:   05/20/24 1358 05/20/24 1405  BP:  133/86  Pulse:    Resp: 13 11  Temp:    SpO2:      Last Pain:  Vitals:   05/20/24 1341  TempSrc: Temporal  PainSc:                  Debby Mines

## 2024-05-23 LAB — SURGICAL PATHOLOGY

## 2024-05-27 ENCOUNTER — Encounter: Payer: Self-pay | Admitting: Nurse Practitioner

## 2024-05-27 ENCOUNTER — Ambulatory Visit: Admitting: Nurse Practitioner

## 2024-05-27 VITALS — BP 136/88 | HR 71 | Temp 98.4°F | Ht 64.0 in | Wt 321.0 lb

## 2024-05-27 DIAGNOSIS — I10 Essential (primary) hypertension: Secondary | ICD-10-CM | POA: Diagnosis not present

## 2024-05-27 DIAGNOSIS — Z2821 Immunization not carried out because of patient refusal: Secondary | ICD-10-CM | POA: Diagnosis not present

## 2024-05-27 LAB — BASIC METABOLIC PANEL WITH GFR
BUN: 16 mg/dL (ref 6–23)
CO2: 28 meq/L (ref 19–32)
Calcium: 8.7 mg/dL (ref 8.4–10.5)
Chloride: 105 meq/L (ref 96–112)
Creatinine, Ser: 0.88 mg/dL (ref 0.40–1.20)
GFR: 71.1 mL/min (ref >60.00)
Glucose, Bld: 89 mg/dL (ref 70–99)
Potassium: 3.5 meq/L (ref 3.5–5.1)
Sodium: 141 meq/L (ref 135–145)

## 2024-05-27 NOTE — Progress Notes (Signed)
 Established Patient Office Visit  Subjective   Patient ID: Stacy Meyer, female    DOB: [birth year]  Age: 63 y.o. MRN: [id number]  Chief Complaint  Patient presents with   Medical Management of Chronic Issues    4 week f/u    HPI  Discussed the use of AI scribe software for clinical note transcription with the patient, who gave verbal consent to proceed.  History of Present Illness Stacy Meyer is a 63 year old female with hypertension who presents for follow-up on blood pressure management and Wegovy  use.  Her blood pressure was elevated during the last visit, and she was started on valsartan  80 mg, which she has been taking without any adverse symptoms. States no more dizziness with the use of the anti hypertensive   She has been using Wegovy  for weight management but had to pause the medication for a colonoscopy. The first attempt at the procedure in September was unsuccessful due to vomiting the prep, requiring rescheduling. She resumed Wegovy  after the procedure and reports feeling better overall. There was a slight weight increase during the medication pause, and she has recently resumed normal urination patterns after the procedure and Wegovy  restart. No pain or abnormal color/odor in her urine.  She does not receive flu shots.    Review of Systems  Constitutional:  Negative for chills and fever.  Respiratory:  Negative for shortness of breath.   Cardiovascular:  Negative for chest pain.  Neurological:  Negative for dizziness and headaches.      Objective:     BP 136/88 (BP Location: Left Arm, Patient Position: Sitting, Cuff Size: Large)   Pulse 71   Temp 98.4 F (36.9 C) (Oral)   Ht 5' 4 (1.626 m)   Wt (!) 321 lb (145.6 kg)   LMP 09/20/2011   SpO2 99%   BMI 55.10 kg/m  BP Readings from Last 3 Encounters:  05/27/24 136/88  05/20/24 133/86  04/21/24 (!) 150/92   Wt Readings from Last 3 Encounters:  05/27/24 (!) 321 lb (145.6 kg)   05/20/24 (!) 314 lb 3.2 oz (142.5 kg)  04/21/24 (!) 318 lb 6.4 oz (144.4 kg)   SpO2 Readings from Last 3 Encounters:  05/27/24 99%  05/20/24 100%  04/21/24 97%      Physical Exam Vitals and nursing note reviewed.  Constitutional:      Appearance: Normal appearance.  Cardiovascular:     Rate and Rhythm: Normal rate and regular rhythm.     Heart sounds: Normal heart sounds.  Pulmonary:     Effort: Pulmonary effort is normal.     Breath sounds: Normal breath sounds.  Abdominal:     General: Bowel sounds are normal. There is no distension.  Neurological:     Mental Status: She is alert.      No results found for any visits on 05/27/24.    The 10-year ASCVD risk score (Arnett DK, et al., 2019) is: 18.1%    Assessment & Plan:   Problem List Items Addressed This Visit       Cardiovascular and Mediastinum   Hypertension, essential - Primary   Relevant Orders   Basic metabolic panel with GFR     Other   Morbid obesity (HCC)   Other Visit Diagnoses       Flu vaccine refused          Assessment and Plan Assessment & Plan Morbid obesity Weight increased slightly due to temporary discontinuation of Wegovy . Weight  loss expected to resume with Wegovy  and lifestyle modifications. - Continue Wegovy  as prescribed. - Encourage lifestyle modifications including diet and exercise. - Schedule follow-up in three months to reassess weight and medication efficacy.  Nausea, likely medication-related Nausea previously associated with Wegovy .  Hypertension Blood pressure well-controlled with valsartan  80 mg daily. Potential to reduce or discontinue medication as weight loss progresses. Monitor kidney function and potassium levels due to valsartan . - Continue valsartan  80 mg daily. - Recheck kidney function and potassium levels.  Return in about 3 months (around 08/27/2024) for weight recheck , DM recheck.    Adina Crandall, NP

## 2024-05-27 NOTE — Patient Instructions (Signed)
 Nice to see you today I will be in touch with the labs once I have them Follow up with me in 3 months, sooner if you need me

## 2024-05-30 ENCOUNTER — Ambulatory Visit: Payer: Self-pay | Admitting: Nurse Practitioner

## 2024-07-15 ENCOUNTER — Other Ambulatory Visit: Payer: Self-pay | Admitting: Nurse Practitioner

## 2024-07-15 DIAGNOSIS — I10 Essential (primary) hypertension: Secondary | ICD-10-CM

## 2024-07-31 ENCOUNTER — Other Ambulatory Visit: Payer: Self-pay | Admitting: Nurse Practitioner

## 2024-08-01 NOTE — Telephone Encounter (Signed)
 Has app 08/26/24

## 2024-08-26 ENCOUNTER — Ambulatory Visit: Admitting: Nurse Practitioner

## 2024-08-26 VITALS — BP 128/90 | HR 69 | Temp 97.9°F | Ht 64.0 in | Wt 317.4 lb

## 2024-08-26 DIAGNOSIS — Z7985 Long-term (current) use of injectable non-insulin antidiabetic drugs: Secondary | ICD-10-CM

## 2024-08-26 DIAGNOSIS — E119 Type 2 diabetes mellitus without complications: Secondary | ICD-10-CM | POA: Diagnosis not present

## 2024-08-26 LAB — POCT GLYCOSYLATED HEMOGLOBIN (HGB A1C): Hemoglobin A1C: 5.4 % (ref 4.0–5.6)

## 2024-08-26 NOTE — Patient Instructions (Signed)
 Nice to see you today See if you insurance covers mounjaro I want to see you in 3 months for your physical and labs

## 2024-08-26 NOTE — Progress Notes (Signed)
 "  Established Patient Office Visit  Subjective   Patient ID: Stacy Meyer, female    DOB: 28-Sep-1960  Age: 64 y.o. MRN: [id number]  Chief Complaint  Patient presents with   Follow-up    HPI  Discussed the use of AI scribe software for clinical note transcription with the patient, who gave verbal consent to proceed.  History of Present Illness Stacy Meyer is a 64 year old female who presents for follow-up on weight management with Wegovy .  She has been using Wegovy  at a dose of 2.4 mg, but reports that it is not working for her anymore. Her initial weight was 359 lbs, and she is currently 317 lbs, indicating some progress, but her weight has plateaued between 315 and 321 lbs.  She experiences sugar cravings and typically consumes one or two small meals a day. She drinks water and occasionally coffee when her asthma flares up, as it helps keep her airways open.  She has not been exercising regularly for the past three months due to knee pain, although she gets some physical activity at work through walking and lifting.  Her blood sugar levels have been stable, with a recent reading of 5.4, down from previous levels of 6.7 and 5.9.  She experiences constipation, having bowel movements every three to four days, and sometimes requires a laxative like Senokot, which takes about two days to take effect.    Review of Systems  Constitutional:  Negative for chills and fever.  Respiratory:  Negative for shortness of breath.   Cardiovascular:  Negative for chest pain.  Gastrointestinal:  Positive for constipation.       BM every 3-4 days   Neurological:  Negative for headaches.  Psychiatric/Behavioral:  Negative for hallucinations and suicidal ideas.       Objective:     BP (!) 128/90   Pulse 69   Temp 97.9 F (36.6 C) (Oral)   Ht 5' 4 (1.626 m)   Wt (!) 317 lb 6.4 oz (144 kg)   LMP 09/20/2011   SpO2 97%   BMI 54.48 kg/m  BP Readings from Last 3 Encounters:   08/26/24 (!) 128/90  05/27/24 136/88  05/20/24 133/86   Wt Readings from Last 3 Encounters:  08/26/24 (!) 317 lb 6.4 oz (144 kg)  05/27/24 (!) 321 lb (145.6 kg)  05/20/24 (!) 314 lb 3.2 oz (142.5 kg)   SpO2 Readings from Last 3 Encounters:  08/26/24 97%  05/27/24 99%  05/20/24 100%      Physical Exam Vitals and nursing note reviewed.  Constitutional:      Appearance: Normal appearance. She is obese.  Cardiovascular:     Rate and Rhythm: Normal rate and regular rhythm.     Heart sounds: Normal heart sounds.  Pulmonary:     Effort: Pulmonary effort is normal.     Breath sounds: Normal breath sounds.  Abdominal:     General: Bowel sounds are normal. There is no distension.     Tenderness: There is no abdominal tenderness.  Neurological:     Mental Status: She is alert.      Results for orders placed or performed in visit on 08/26/24  POCT glycosylated hemoglobin (Hb A1C)  Result Value Ref Range   Hemoglobin A1C 5.4 4.0 - 5.6 %   HbA1c POC (<> result, manual entry)     HbA1c, POC (prediabetic range)     HbA1c, POC (controlled diabetic range)        The  10-year ASCVD risk score (Arnett DK, et al., 2019) is: 15.5%    Assessment & Plan:   Problem List Items Addressed This Visit       Other   Well controlled diabetes mellitus (HCC) - Primary   Relevant Orders   POCT glycosylated hemoglobin (Hb A1C) (Completed)   Assessment and Plan Assessment & Plan Morbid obesity Weight decreased from 359 lbs to 317 lbs but plateaued. Wegovy  ineffective. Discussed Mounjaro for weight loss and glycemic control, pending insurance. Difficulty with weight loss post-hysterectomy possibly due to hormonal changes. - consider switching to Mounjaro, checked insurance coverage. - Encouraged smaller, protein-driven meals with minimal carbohydrates. - Advised increasing physical activity as knee pain improves and weather changes.  Type 2 diabetes mellitus Glycemic control excellent  with HbA1c at 5.4. Mounjaro may aid further if covered by insurance.  Essential hypertension Blood pressure slightly elevated today. - Rechecked blood pressure before leaving the office.  Constipation Bowel movements every 3-4 days, requiring laxatives. Discussed stool softeners or Miralax  for regularity. - Recommended stool softener every other day. - Consider Miralax  daily for regularity.   Return in about 3 months (around 11/24/2024) for CPE and Labs.    Adina Crandall, NP  "

## 2024-08-30 ENCOUNTER — Other Ambulatory Visit: Payer: Self-pay | Admitting: Nurse Practitioner

## 2024-09-07 ENCOUNTER — Telehealth: Payer: Self-pay | Admitting: Nurse Practitioner

## 2024-09-07 DIAGNOSIS — E119 Type 2 diabetes mellitus without complications: Secondary | ICD-10-CM

## 2024-09-07 MED ORDER — MOUNJARO 2.5 MG/0.5ML ~~LOC~~ SOAJ: 2.5000 mg | SUBCUTANEOUS | 0 refills | Status: AC

## 2024-09-07 NOTE — Telephone Encounter (Signed)
 Called and spoke with patient Relayed information. Pt has no questions or concerns.

## 2024-09-07 NOTE — Telephone Encounter (Signed)
 Copied from CRM #[id number]. Topic: Clinical - Medication Prior Auth >> Sep 06, 2024  3:47 PM Berneda FALCON wrote: Reason for CRM: Patient is calling back checking on the status of her request for prior auth for Mounjaro . States she sent this last week and has not heard back. Can we please double check on this for her?  Patient callback is [phone] (home) [phone] (work)

## 2024-09-07 NOTE — Addendum Note (Signed)
 Addended by: WENDEE LYNWOOD HERO on: 09/07/2024 12:59 PM   Modules accepted: Orders

## 2024-09-07 NOTE — Telephone Encounter (Signed)
 We discussed switching but did not. I have ordered the mounjaro  for the patient. This is a 1:1 meaning she will take the mounjaro  on the day she was suppose to take the wegovy .   She will stop the wegovy  once the mounjaro  is approved

## 2024-09-07 NOTE — Telephone Encounter (Unsigned)
 Copied from CRM [id number]. Topic: Clinical - Medication Prior Auth >> Aug 29, 2024 12:34 PM Stacy Meyer wrote: Reason for CRM: pt stated PCP needs to call [phone], [facility], to get approval code for Mounjaro .   Please call back to advise,  [phone] >> Sep 06, 2024  3:44 PM Stacy Meyer wrote: Patient is calling back to check on the status of her prior authorization. States PCP was supposed to call in Mounjaro  for her, but called in WeGovy  instead. She called back on Monday and requested that he send over a prior authorization but has not received any updates on this. Can we please have someone call the patient and give her a status update on this, even if the medication is pending with the prior authorization?  Patient callback is [phone] (home) [phone] (work)

## 2024-09-07 NOTE — Telephone Encounter (Signed)
 Left detailed voicemail for patient to call the office back.

## 2024-11-25 ENCOUNTER — Encounter: Admitting: Nurse Practitioner
